# Patient Record
Sex: Female | Born: 2000 | Race: Black or African American | Hispanic: No | Marital: Single | Smoking: Never smoker
Health system: Southern US, Community
[De-identification: ages and names within clinical notes are randomized; demographics above are authoritative.]

## PROBLEM LIST (undated history)

## (undated) DIAGNOSIS — J45909 Unspecified asthma, uncomplicated: Secondary | ICD-10-CM

## (undated) DIAGNOSIS — A549 Gonococcal infection, unspecified: Secondary | ICD-10-CM

## (undated) DIAGNOSIS — A599 Trichomoniasis, unspecified: Secondary | ICD-10-CM

## (undated) DIAGNOSIS — A749 Chlamydial infection, unspecified: Secondary | ICD-10-CM

## (undated) HISTORY — DX: Chlamydial infection, unspecified: A74.9

## (undated) HISTORY — DX: Gonococcal infection, unspecified: A54.9

## (undated) HISTORY — DX: Trichomoniasis, unspecified: A59.9

---
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
## 2021-01-15 ENCOUNTER — Encounter (HOSPITAL_COMMUNITY): Payer: Self-pay

## 2021-01-15 ENCOUNTER — Other Ambulatory Visit: Payer: Self-pay

## 2021-01-15 ENCOUNTER — Ambulatory Visit (HOSPITAL_COMMUNITY)
Admission: RE | Admit: 2021-01-15 | Discharge: 2021-01-15 | Disposition: A | Discharge status: Home / Self Care | Payer: Medicaid Other | Source: Ambulatory Visit | Attending: Pediatrics | Admitting: Pediatrics

## 2021-01-15 VITALS — BP 125/83 | HR 80 | Temp 98.6°F | Resp 18

## 2021-01-15 DIAGNOSIS — N898 Other specified noninflammatory disorders of vagina: Secondary | ICD-10-CM

## 2021-01-15 NOTE — Discharge Instructions (Addendum)
Do not shave the inner areas of your labia.  Be careful when you are shaving.    Follow up with your primary care provider or gynecologist if your symptoms are not improving.

## 2021-01-15 NOTE — ED Triage Notes (Signed)
Patient reports a "cut" on labia.  Noticed this on Friday.  Denies vaginal discharge.  Patient did have pelvic pain on Friday, but this has gone away.  Denies urinary issues

## 2021-01-15 NOTE — ED Notes (Signed)
Instructed to place on gown 

## 2021-01-15 NOTE — ED Provider Notes (Signed)
[facility]    CSN: [id number] Arrival date & time: 01/15/21  1037      History   Chief Complaint Chief Complaint  Patient presents with  . Appointment    11:00  . Abdominal Pain    HPI Samantha Long is a 20 y.o. female.   Patient presents with a "cut" on her labia x5 days.  She states this occurred when she shaved.  She denies abdominal pain, dysuria, hematuria, vaginal discharge, pelvic pain, or other symptoms.  No treatments attempted at home.  Patient was seen here on 1/142022; treated with ceftriaxone and doxycycline; positive for gonorrhea and bacterial vaginitis.  Her medical history includes gonorrhea, chlamydia, trichomonas, PID, obesity.  The history is provided by the patient.    Past Medical History:  Diagnosis Date  . Chlamydia 09/24/2018  . Gonorrhea 09/24/2018  . Trichimoniasis 09/24/2018    Patient Active Problem List   Diagnosis Date Noted  . Vitamin D deficiency 12/09/2018  . Mixed hyperlipidemia 12/09/2018  . History of sexually transmitted disease 11/02/2018  . 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 Pelvic inflammatory disease (PID) 09/24/2018  . 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 No pertinent surgical history.  OB History   No obstetric history on file.      Home Medications    Prior to Admission medications   Not on File    Family History History reviewed. No pertinent family history.  Social History Social History   Tobacco Use  . Smoking status: Passive Smoke Exposure - Never Smoker  . Smokeless tobacco: Never Used  Substance Use Topics  . Alcohol use: No  . Drug use: No     Allergies   Amoxicillin   Review of Systems Review of Systems  Constitutional: Negative for chills and fever.  HENT: Negative for ear pain and sore throat.   Eyes: Negative for pain and visual  disturbance.  Respiratory: Negative for cough and shortness of breath.   Cardiovascular: Negative for chest pain and palpitations.  Gastrointestinal: Negative for abdominal pain, diarrhea and vomiting.  Genitourinary: Negative for dysuria, flank pain, hematuria and vaginal discharge.  Musculoskeletal: Negative for arthralgias and back pain.  Skin: Negative for color change and rash.  Neurological: Negative for seizures and syncope.  All other systems reviewed and are negative.    Physical Exam Triage Vital Signs ED Triage Vitals  Enc Vitals Group     BP 01/15/21 1100 125/83     Pulse Rate 01/15/21 1100 80     Resp 01/15/21 1100 18     Temp 01/15/21 1100 98.6 F (37 C)     Temp Source 01/15/21 1100 Oral     SpO2 01/15/21 1100 97 %     Weight --      Height --      Head Circumference --      Peak Flow --      Pain Score 01/15/21 1056 5     Pain Loc --      Pain Edu? --      Excl. in GC? --    No data found.  Updated Vital Signs BP 125/83 (BP Location: Left Arm) Comment (BP Location): large cuff  Pulse 80   Temp 98.6 F (37 C) (Oral)   Resp 18   LMP 12/19/2020   SpO2 97%   Visual Acuity Right Eye Distance:  Left Eye Distance:   Bilateral Distance:    Right Eye Near:   Left Eye Near:    Bilateral Near:     Physical Exam Vitals and nursing note reviewed.  Constitutional:      General: She is not in acute distress.    Appearance: She is well-developed and well-nourished.  HENT:     Head: Normocephalic and atraumatic.     Mouth/Throat:     Mouth: Mucous membranes are moist.  Eyes:     Conjunctiva/sclera: Conjunctivae normal.  Cardiovascular:     Rate and Rhythm: Normal rate and regular rhythm.     Heart sounds: Normal heart sounds.  Pulmonary:     Effort: Pulmonary effort is normal. No respiratory distress.     Breath sounds: Normal breath sounds.  Abdominal:     Palpations: Abdomen is soft.     Tenderness: There is no abdominal tenderness. There is no  right CVA tenderness, left CVA tenderness, guarding or rebound.  Genitourinary:    Exam position: Lithotomy position.     Vagina: No vaginal discharge.       Comments: Healing area of slight redness on right labia minora. No vaginal discharge.   Musculoskeletal:        General: No edema.     Cervical back: Neck supple.  Skin:    General: Skin is warm and dry.  Neurological:     General: No focal deficit present.     Mental Status: She is alert and oriented to person, place, and time.     Gait: Gait normal.  Psychiatric:        Mood and Affect: Mood and affect and mood normal.        Behavior: Behavior normal.      UC Treatments / Results  Labs (all labs ordered are listed, but only abnormal results are displayed) Labs Reviewed - No data to display  EKG   Radiology No results found.  Procedures Procedures (including critical care time)  Medications Ordered in UC Medications - No data to display  Initial Impression / Assessment and Plan / UC Course  I have reviewed the triage vital signs and the nursing notes.  Pertinent labs & imaging results that were available during my care of the patient were reviewed by me and considered in my medical decision making (see chart for details).   Vaginal irritation.  Instructed patient not to shave the inner labia and to be careful when she shaves her genitalia.  Instructed her to follow-up with her PCP or gynecologist if needed.  She agrees to plan of care.   Final Clinical Impressions(s) / UC Diagnoses   Final diagnoses:  Vaginal irritation     Discharge Instructions     Do not shave the inner areas of your labia.  Be careful when you are shaving.    Follow up with your primary care provider or gynecologist if your symptoms are not improving.          ED Prescriptions    None     PDMP not reviewed this encounter.   Mickie Bail, NP 01/15/21 1128

## 2021-02-04 ENCOUNTER — Ambulatory Visit (HOSPITAL_COMMUNITY)
Admission: RE | Admit: 2021-02-04 | Discharge: 2021-02-04 | Disposition: A | Discharge status: Home / Self Care | Payer: Medicaid Other | Source: Ambulatory Visit | Attending: Emergency Medicine | Admitting: Emergency Medicine

## 2021-02-04 ENCOUNTER — Other Ambulatory Visit: Payer: Self-pay

## 2021-02-04 ENCOUNTER — Encounter (HOSPITAL_COMMUNITY): Payer: Self-pay

## 2021-02-04 VITALS — BP 131/74 | HR 77 | Temp 98.0°F | Resp 18

## 2021-02-04 DIAGNOSIS — N760 Acute vaginitis: Secondary | ICD-10-CM | POA: Insufficient documentation

## 2021-02-04 MED ORDER — CEFTRIAXONE SODIUM 500 MG IJ SOLR
INTRAMUSCULAR | Status: AC
  Filled 2021-02-04: qty 500

## 2021-02-04 MED ORDER — DOXYCYCLINE HYCLATE 100 MG PO CAPS: 100.0000 mg | ORAL_CAPSULE | Freq: Two times a day (BID) | ORAL | 0 refills | Status: AC

## 2021-02-04 MED ORDER — CEFTRIAXONE SODIUM 500 MG IJ SOLR
500.0000 mg | Freq: Once | INTRAMUSCULAR | Status: AC
  Administered 2021-02-04: 500 mg via INTRAMUSCULAR

## 2021-02-04 MED ORDER — LIDOCAINE HCL (PF) 1 % IJ SOLN
INTRAMUSCULAR | Status: AC
  Filled 2021-02-04: qty 2

## 2021-02-04 MED ORDER — FLUCONAZOLE 200 MG PO TABS
ORAL_TABLET | ORAL | 0 refills | Status: DC
Start: 2021-02-04 — End: 2021-07-10

## 2021-02-04 NOTE — Discharge Instructions (Addendum)
We have initiated std treatment, as well as treatment for yeast infection.  We will notify of you any positive findings or if any changes to treatment are needed. If normal or otherwise without concern to your results, we will not call you. Please log on to your MyChart to review your results if interested in so.   Please withhold from intercourse for the next week. Please use condoms to prevent STD's.

## 2021-02-04 NOTE — ED Provider Notes (Signed)
[facility]    CSN: [id number] Arrival date & time: 02/04/21  1145      History   Chief Complaint Chief Complaint  Patient presents with  . APPOINTMENT: Vaginitis, Vaginal Discharge     HPI Samantha Long is a 20 y.o. female.   Samantha Long presents with complaints of vaginal discharge, itching and irritation which started approximately 1 week ago. No vaginal bleeding. No pelvic pain. No sores or lesions externally. Discharge is thick and white. She states she was exposed to a partner who had an std and did not seek treatment- didn't use condoms with this encounter. No urinary symptoms. LMP 2/13. Positive for gonorrhea 1/13.    ROS per HPI, negative if not otherwise mentioned.      Past Medical History:  Diagnosis Date  . Chlamydia 09/24/2018  . Gonorrhea 09/24/2018  . Trichimoniasis 09/24/2018    Patient Active Problem List   Diagnosis Date Noted  . Vitamin D deficiency 12/09/2018  . Mixed hyperlipidemia 12/09/2018  . History of sexually transmitted disease 11/02/2018  . 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 Pelvic inflammatory disease (PID) 09/24/2018  . 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 No pertinent surgical history.  OB History   No obstetric history on file.      Home Medications    Prior to Admission medications   Medication Sig Start Date End Date Taking? Authorizing Provider  doxycycline (VIBRAMYCIN) 100 MG capsule Take 1 capsule (100 mg total) by mouth 2 (two) times daily for 7 days. 02/04/21 02/11/21 Yes Joselynn Amoroso, Dorene Grebe B, NP  fluconazole (DIFLUCAN) 200 MG tablet Take once today. Take second pill at completion of antibiotics. 02/04/21  Yes Georgetta Haber, NP    Family History History reviewed. No pertinent family history.  Social History Social History   Tobacco Use  . Smoking status: Passive Smoke  Exposure - Never Smoker  . Smokeless tobacco: Never Used  Substance Use Topics  . Alcohol use: No  . Drug use: No     Allergies   Amoxicillin   Review of Systems Review of Systems   Physical Exam Triage Vital Signs ED Triage Vitals  Enc Vitals Group     BP 02/04/21 1220 131/74     Pulse Rate 02/04/21 1220 77     Resp 02/04/21 1220 18     Temp 02/04/21 1220 98 F (36.7 C)     Temp Source 02/04/21 1220 Oral     SpO2 02/04/21 1220 98 %     Weight --      Height --      Head Circumference --      Peak Flow --      Pain Score 02/04/21 1219 5     Pain Loc --      Pain Edu? --      Excl. in GC? --    No data found.  Updated Vital Signs BP 131/74 (BP Location: Left Arm)   Pulse 77   Temp 98 F (36.7 C) (Oral)   Resp 18   LMP 01/20/2021   SpO2 98%   Visual Acuity Right Eye Distance:   Left Eye Distance:   Bilateral Distance:    Right Eye Near:   Left Eye Near:    Bilateral Near:     Physical Exam Constitutional:      General: She is not in  acute distress.    Appearance: She is well-developed.  Cardiovascular:     Rate and Rhythm: Normal rate.  Pulmonary:     Effort: Pulmonary effort is normal.  Abdominal:     Palpations: Abdomen is not rigid.     Tenderness: There is no abdominal tenderness. There is no guarding or rebound.  Genitourinary:    Comments: Denies sores, lesions, vaginal bleeding; no pelvic pain; gu exam deferred at this time, vaginal self swab collected.   Skin:    General: Skin is warm and dry.  Neurological:     Mental Status: She is alert and oriented to person, place, and time.      UC Treatments / Results  Labs (all labs ordered are listed, but only abnormal results are displayed) Labs Reviewed  CERVICOVAGINAL ANCILLARY ONLY    EKG   Radiology No results found.  Procedures Procedures (including critical care time)  Medications Ordered in UC Medications  cefTRIAXone (ROCEPHIN) injection 500 mg (has no  administration in time range)    Initial Impression / Assessment and Plan / UC Course  I have reviewed the triage vital signs and the nursing notes.  Pertinent labs & imaging results that were available during my care of the patient were reviewed by me and considered in my medical decision making (see chart for details).     Empiric std treatment initiated given patient's concern for exposure and recent positive testing. Diflucan also provided. Vaginal cytology pending. Safe sex encouraged. Patient verbalized understanding and agreeable to plan.   Final Clinical Impressions(s) / UC Diagnoses   Final diagnoses:  Acute vaginitis     Discharge Instructions     We have initiated std treatment, as well as treatment for yeast infection.  We will notify of you any positive findings or if any changes to treatment are needed. If normal or otherwise without concern to your results, we will not call you. Please log on to your MyChart to review your results if interested in so.   Please withhold from intercourse for the next week. Please use condoms to prevent STD's.      ED Prescriptions    Medication Sig Dispense Auth. Provider   doxycycline (VIBRAMYCIN) 100 MG capsule Take 1 capsule (100 mg total) by mouth 2 (two) times daily for 7 days. 14 capsule Linus Mako B, NP   fluconazole (DIFLUCAN) 200 MG tablet Take once today. Take second pill at completion of antibiotics. 2 tablet Georgetta Haber, NP     PDMP not reviewed this encounter.   Georgetta Haber, NP 02/04/21 1244

## 2021-02-04 NOTE — ED Triage Notes (Signed)
Pt presents with vaginal irritation, vaginal discharge & itching X 1 week.

## 2021-02-05 ENCOUNTER — Other Ambulatory Visit: Payer: Self-pay | Admitting: Pediatrics

## 2021-02-05 LAB — CERVICOVAGINAL ANCILLARY ONLY
Bacterial Vaginitis (gardnerella): POSITIVE — AB
Candida Glabrata: NEGATIVE
Candida Vaginitis: POSITIVE — AB
Chlamydia: NEGATIVE
Comment: NEGATIVE
Comment: NEGATIVE
Comment: NEGATIVE
Comment: NEGATIVE
Comment: NEGATIVE
Comment: NORMAL
Neisseria Gonorrhea: NEGATIVE
Trichomonas: POSITIVE — AB

## 2021-02-05 MED ORDER — METRONIDAZOLE 500 MG PO TABS: 2000.0000 mg | ORAL_TABLET | Freq: Once | ORAL | 0 refills | Status: AC

## 2021-06-24 IMAGING — CR DG SHOULDER 2+V*R*
4 series · 4 of 4 positions shown · non-contrast
Comparison: None.

CLINICAL DATA: Shoulder pain.

EXAM:
RIGHT SHOULDER - 2+ VIEW

[w shoulder external right]
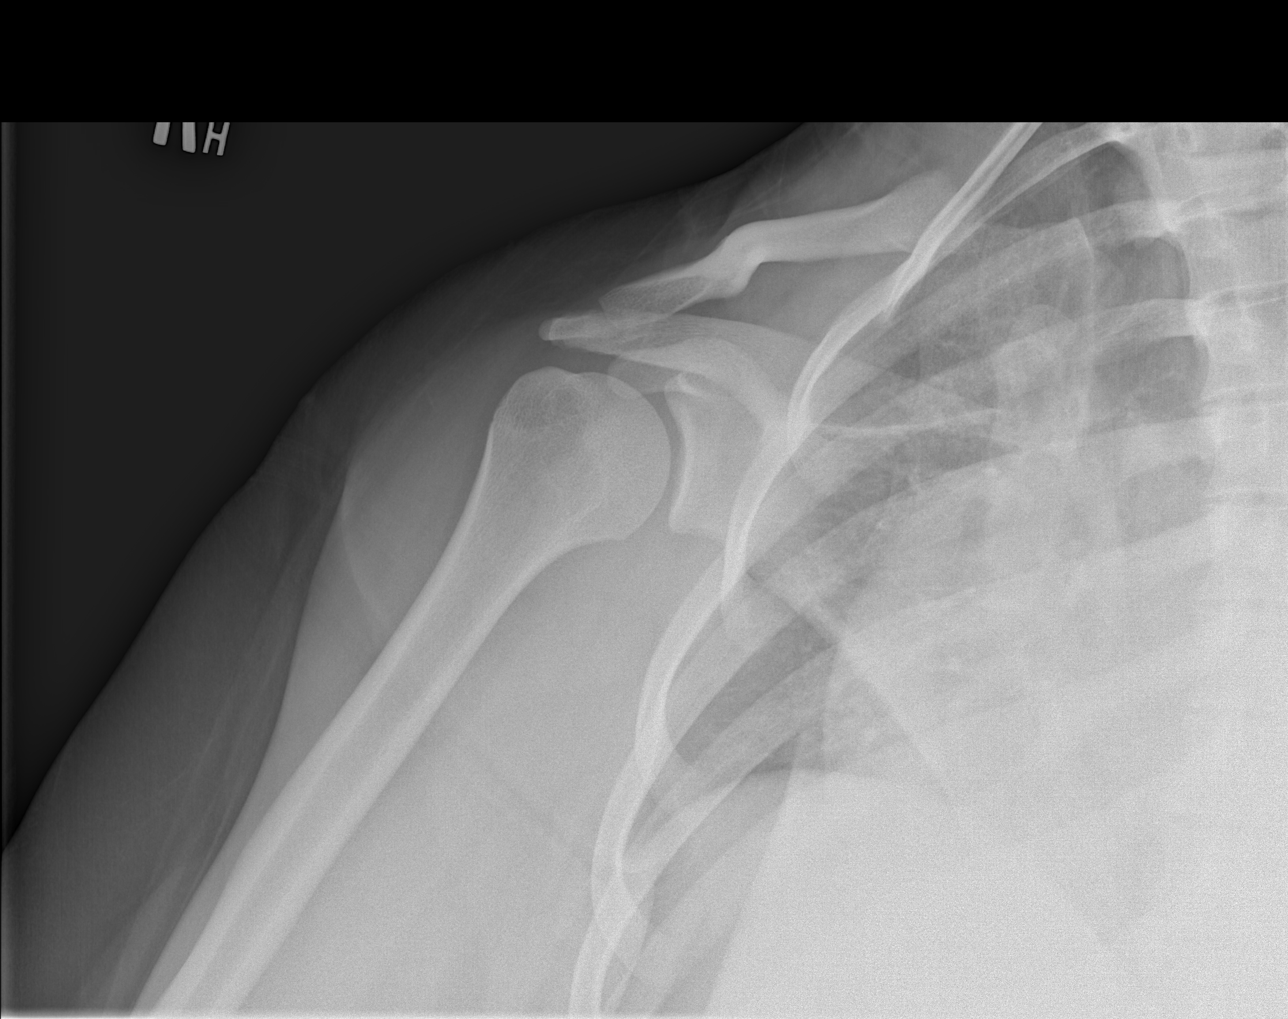

[w shoulder y-view right (1 of 2)]
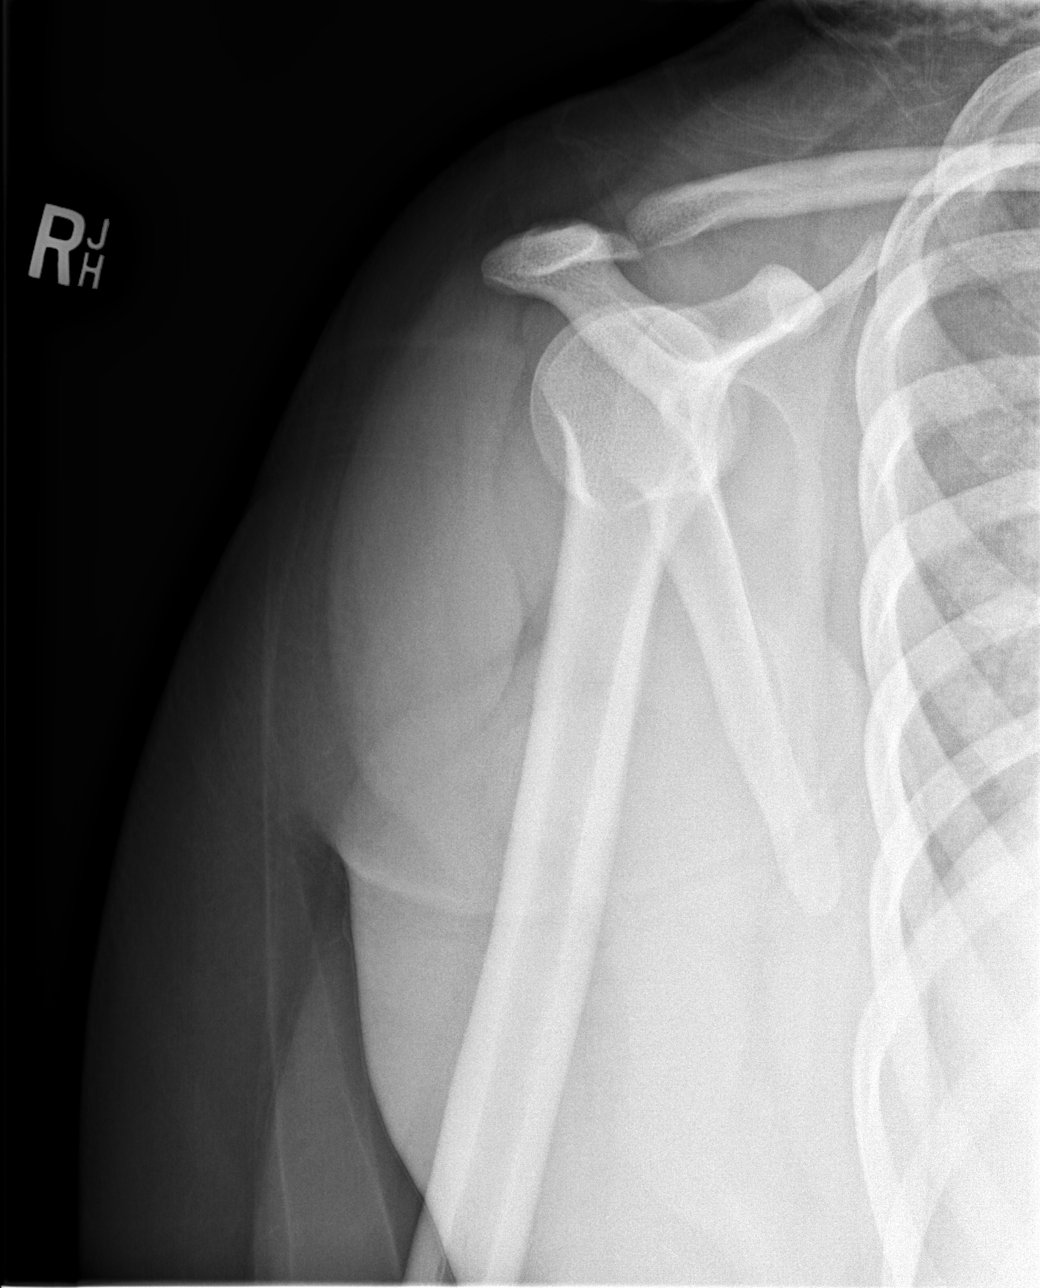

[w shoulder y-view right (2 of 2)]
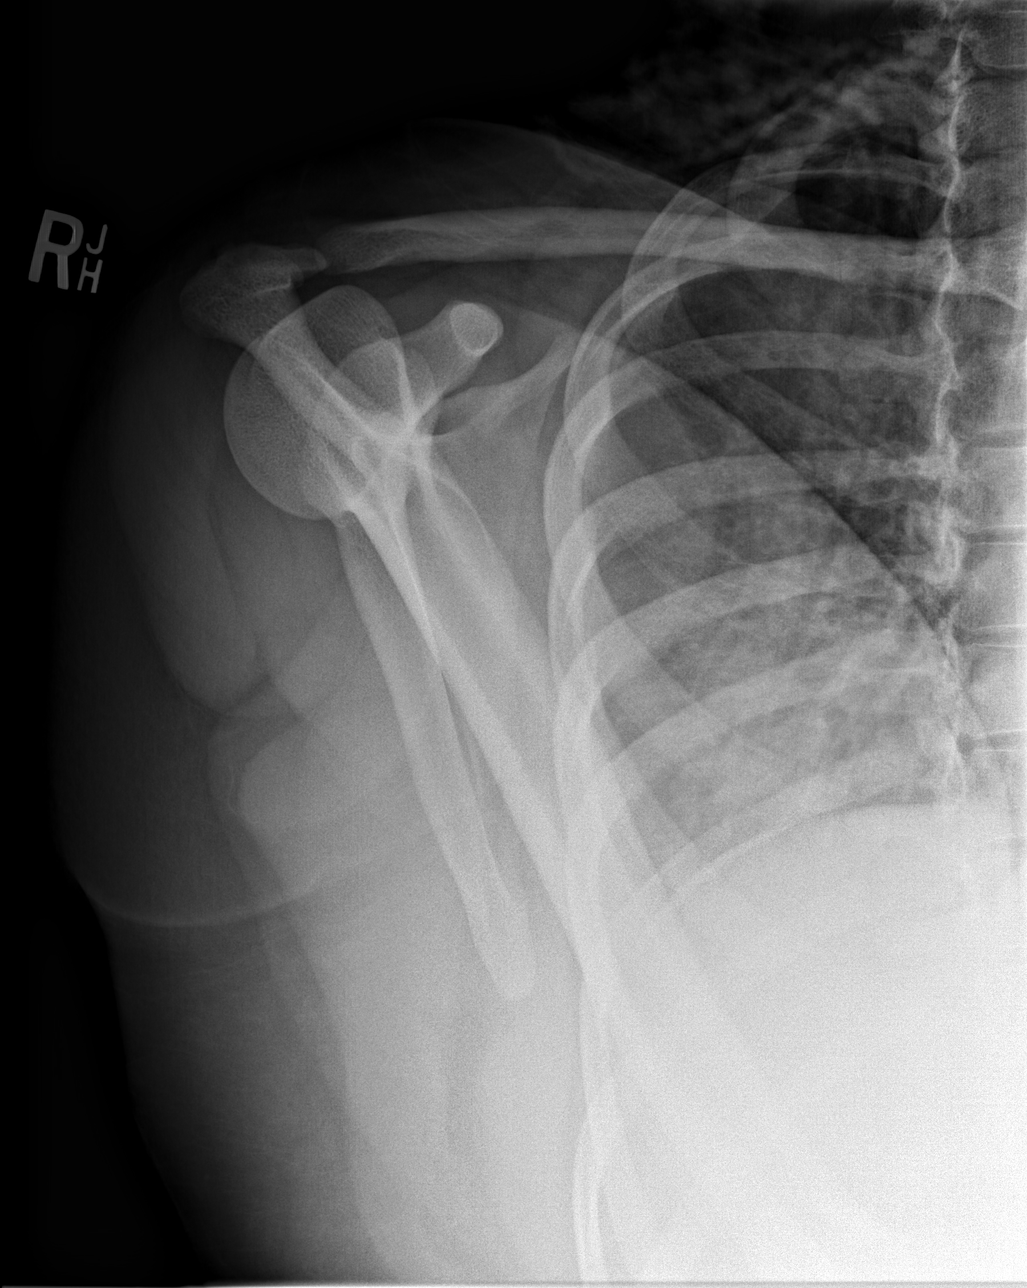

[x shoulder axillary right]
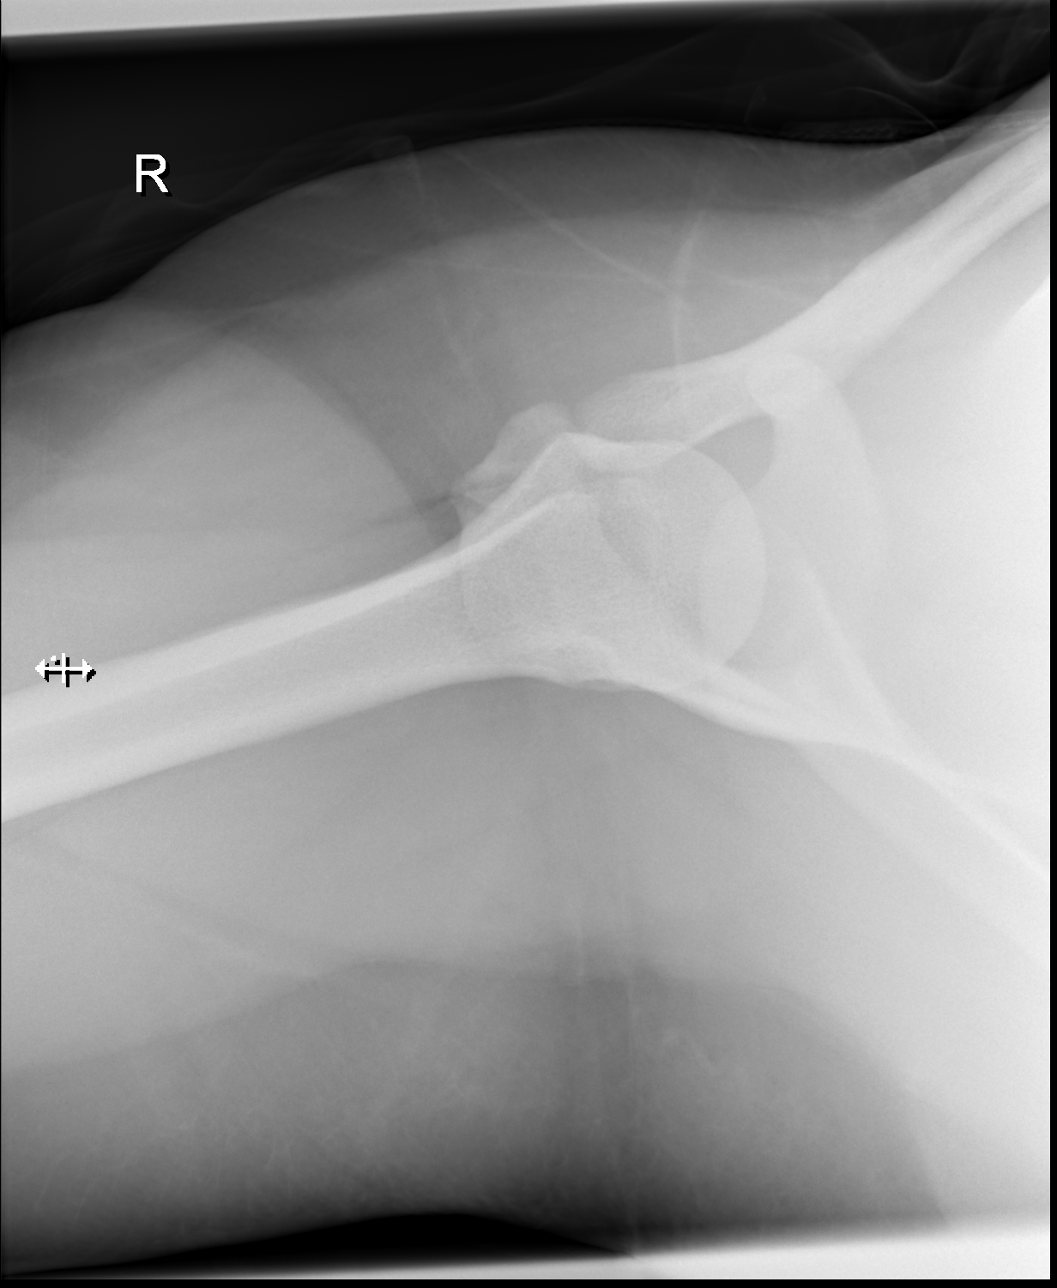

[4 of 4 positions shown; findings below may reference images not displayed]

FINDINGS: There is no evidence of fracture or dislocation. There is no
evidence of arthropathy or other focal bone abnormality. Soft
tissues are unremarkable.
IMPRESSION: Negative.

## 2021-07-10 ENCOUNTER — Other Ambulatory Visit: Payer: Self-pay

## 2021-07-10 ENCOUNTER — Ambulatory Visit (HOSPITAL_COMMUNITY)
Admission: RE | Admit: 2021-07-10 | Discharge: 2021-07-10 | Disposition: A | Discharge status: Home / Self Care | Payer: Medicaid Other | Source: Ambulatory Visit | Attending: Student | Admitting: Student

## 2021-07-10 ENCOUNTER — Encounter (HOSPITAL_COMMUNITY): Payer: Self-pay

## 2021-07-10 VITALS — BP 125/67 | HR 77 | Temp 99.0°F | Resp 18

## 2021-07-10 DIAGNOSIS — N3001 Acute cystitis with hematuria: Secondary | ICD-10-CM | POA: Diagnosis present

## 2021-07-10 LAB — POCT URINALYSIS DIPSTICK, ED / UC
Bilirubin Urine: NEGATIVE
Glucose, UA: NEGATIVE mg/dL
Ketones, ur: NEGATIVE mg/dL
Nitrite: NEGATIVE
Protein, ur: 30 mg/dL — AB
Specific Gravity, Urine: 1.02 (ref 1.005–1.030)
Urobilinogen, UA: 1 mg/dL (ref 0.0–1.0)
pH: 6 (ref 5.0–8.0)

## 2021-07-10 LAB — POC URINE PREG, ED: Preg Test, Ur: NEGATIVE

## 2021-07-10 MED ORDER — NITROFURANTOIN MONOHYD MACRO 100 MG PO CAPS: 100.0000 mg | ORAL_CAPSULE | Freq: Two times a day (BID) | ORAL | 0 refills | Status: DC

## 2021-07-10 NOTE — Discharge Instructions (Addendum)
-  Macrobid (nitrofurantoin) twice daily x5 days -Drink plenty of water to help flush the kidneys and bladder -Seek additional medical attention if symptoms are getting worse instead of better, like abdominal pain, back pain, fever/chills.

## 2021-07-10 NOTE — ED Provider Notes (Signed)
[facility]    CSN: [id number] Arrival date & time: 07/10/21  1500      History   Chief Complaint Chief Complaint  Patient presents with   Appointment    1300   Urinary Frequency   Abdominal Pain    HPI Samantha Long is a 20 y.o. female presenting with urinary frequency and abd pain x2 days. Medical history C/G/T. Last positive for BV and gonorrhea 12/2020, and BV, trich, candida 01/2021. Suprapubic pressure and dysuria and frequency. Denies hematuria, urgency, back pain, n/v/d, fevers/chills, abdnormal vaginal discharge/lesions. Denies new partners. Not on her period currently.   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 No pertinent surgical history.  OB History   No obstetric history on file.      Home Medications    Prior to Admission medications   Medication Sig Start Date End Date Taking? Authorizing Provider  nitrofurantoin, macrocrystal-monohydrate, (MACROBID) 100 MG capsule Take 1 capsule (100 mg total) by mouth 2 (two) times daily. 07/10/21  Yes Rhys Martini, PA-C    Family History History reviewed. No pertinent family history.  Social History Social History   Tobacco Use   Smoking status: Passive Smoke Exposure - Never Smoker   Smokeless tobacco: Never  Substance Use Topics   Alcohol use: No   Drug use: No     Allergies   Amoxicillin   Review of Systems Review of Systems  Constitutional:  Negative for appetite change, chills,  diaphoresis and fever.  Respiratory:  Negative for shortness of breath.   Cardiovascular:  Negative for chest pain.  Gastrointestinal:  Positive for abdominal pain. Negative for blood in stool, constipation, diarrhea, nausea and vomiting.  Genitourinary:  Positive for dysuria and frequency. Negative for decreased urine volume, difficulty urinating, flank pain, genital sores, hematuria and urgency.  Musculoskeletal:  Negative for back pain.  Neurological:  Negative for dizziness, weakness and light-headedness.  All other systems reviewed and are negative.   Physical Exam Triage Vital Signs ED Triage Vitals  Enc Vitals Group     BP 07/10/21 1538 125/67     Pulse Rate 07/10/21 1538 77     Resp 07/10/21 1538 18     Temp 07/10/21 1538 99 F (37.2 C)     Temp Source 07/10/21 1538 Oral     SpO2 07/10/21 1538 100 %     Weight --      Height --      Head Circumference --      Peak Flow --      Pain Score 07/10/21 1536 8     Pain Loc --      Pain Edu? --      Excl. in [place]? --    No data found.  Updated Vital Signs BP 125/67 (BP Location: Left Arm)   Pulse 77   Temp 99 F (37.2 C) (Oral)   Resp 18   LMP  (Approximate) Comment: 1 month  SpO2 100%   Visual Acuity Right  Eye Distance:   Left Eye Distance:   Bilateral Distance:    Right Eye Near:   Left Eye Near:    Bilateral Near:     Physical Exam Vitals reviewed.  Constitutional:      General: She is not in acute distress.    Appearance: Normal appearance. She is not ill-appearing.  HENT:     Head: Normocephalic and atraumatic.     Mouth/Throat:     Mouth: Mucous membranes are moist.     Comments: Moist mucous membranes Eyes:     Extraocular Movements: Extraocular movements intact.     Pupils: Pupils are equal, round, and reactive to light.  Cardiovascular:     Rate and Rhythm: Normal rate and regular rhythm.     Heart sounds: Normal heart sounds.  Pulmonary:     Effort: Pulmonary effort is normal.     Breath  sounds: Normal breath sounds. No wheezing, rhonchi or rales.  Abdominal:     General: Abdomen is flat. Bowel sounds are normal. There is no distension.     Palpations: Abdomen is soft. There is no mass.     Tenderness: There is abdominal tenderness in the suprapubic area. There is no right CVA tenderness, left CVA tenderness, guarding or rebound. Negative signs include Murphy's sign, Rovsing's sign and McBurney's sign.  Skin:    General: Skin is warm.     Capillary Refill: Capillary refill takes less than 2 seconds.     Comments: Good skin turgor  Neurological:     General: No focal deficit present.     Mental Status: She is alert and oriented to person, place, and time.  Psychiatric:        Mood and Affect: Mood normal.        Behavior: Behavior normal.     UC Treatments / Results  Labs (all labs ordered are listed, but only abnormal results are displayed) Labs Reviewed  POCT URINALYSIS DIPSTICK, ED / UC - Abnormal; Notable for the following components:      Result Value   Hgb urine dipstick MODERATE (*)    Protein, ur 30 (*)    Leukocytes,Ua SMALL (*)    All other components within normal limits  URINE CULTURE  POC URINE PREG, ED    EKG   Radiology No results found.  Procedures Procedures (including critical care time)  Medications Ordered in UC Medications - No data to display  Initial Impression / Assessment and Plan / UC Course  I have reviewed the triage vital signs and the nursing notes.  Pertinent labs & imaging results that were available during my care of the patient were reviewed by me and considered in my medical decision making (see chart for details).     This patient is a very pleasant 20 y.o. year old female presenting with acute cystitis with hematuria. Afebrile, nontachy, suprapubic pressure but no CVAT. History BV, yeast, G/C/T.  UA with blood and trace leuk. Culture sent. Denies STI risk, did not send for testing.  Negative  u-preg.  Penicillin allergic. Will treat for UTI with macrobid as below.   ED return precautions discussed. Patient verbalizes understanding and agreement.   Coding this visit a level 4 for review of past notes and labs, order and interpretation of labs today, and prescription drug management.  Final Clinical Impressions(s) / UC Diagnoses   Final diagnoses:  Acute cystitis with hematuria     Discharge Instructions      -Macrobid (nitrofurantoin) twice  daily x5 days -Drink plenty of water to help flush the kidneys and bladder -Seek additional medical attention if symptoms are getting worse instead of better, like abdominal pain, back pain, fever/chills.     ED Prescriptions     Medication Sig Dispense Auth. Provider   nitrofurantoin, macrocrystal-monohydrate, (MACROBID) 100 MG capsule Take 1 capsule (100 mg total) by mouth 2 (two) times daily. 10 capsule Rhys Martini, PA-C      PDMP not reviewed this encounter.   Rhys Martini, PA-C 07/10/21 [id number]

## 2021-07-10 NOTE — ED Triage Notes (Signed)
Pt report lower abdominal pain, nausea and increased urinary frequency x 2 days.

## 2021-07-12 LAB — URINE CULTURE: Culture: 30000 — AB

## 2021-08-04 ENCOUNTER — Ambulatory Visit (HOSPITAL_COMMUNITY): Payer: Self-pay

## 2022-02-01 ENCOUNTER — Ambulatory Visit (HOSPITAL_COMMUNITY): Payer: Self-pay

## 2022-02-04 ENCOUNTER — Ambulatory Visit (HOSPITAL_COMMUNITY)
Admission: RE | Admit: 2022-02-04 | Discharge: 2022-02-04 | Disposition: A | Discharge status: Home / Self Care | Payer: Medicaid Other | Source: Ambulatory Visit | Attending: Emergency Medicine | Admitting: Emergency Medicine

## 2022-02-04 ENCOUNTER — Encounter (HOSPITAL_COMMUNITY): Payer: Self-pay

## 2022-02-04 ENCOUNTER — Other Ambulatory Visit: Payer: Self-pay

## 2022-02-04 VITALS — BP 131/81 | HR 79 | Temp 98.6°F | Resp 16 | Ht 62.0 in | Wt 190.0 lb

## 2022-02-04 DIAGNOSIS — R350 Frequency of micturition: Secondary | ICD-10-CM | POA: Diagnosis not present

## 2022-02-04 LAB — POCT URINALYSIS DIPSTICK, ED / UC
Bilirubin Urine: NEGATIVE
Glucose, UA: NEGATIVE mg/dL
Ketones, ur: NEGATIVE mg/dL
Nitrite: NEGATIVE
Protein, ur: NEGATIVE mg/dL
Specific Gravity, Urine: 1.02 (ref 1.005–1.030)
Urobilinogen, UA: 0.2 mg/dL (ref 0.0–1.0)
pH: 6 (ref 5.0–8.0)

## 2022-02-04 LAB — HIV ANTIBODY (ROUTINE TESTING W REFLEX): HIV Screen 4th Generation wRfx: NONREACTIVE

## 2022-02-04 LAB — RPR: RPR Ser Ql: NONREACTIVE

## 2022-02-04 NOTE — ED Provider Notes (Signed)
[facility]    CSN: [id number] Arrival date & time: 02/04/22  1148      History   Chief Complaint Chief Complaint  Patient presents with   xxx    HPI Samantha Long is a 21 y.o. female.   Patient presents with urinary frequency over the last week.  Denies frequency, hematuria, dysuria, vaginal discharge, itching, odor, abdominal pain or pressure, flank pain, fever, chills.  Sexually active, 1 partner, always condom use.  No known exposure.  Requesting STD testing.  History of gonorrhea, chlamydia and trichomoniasis.  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 No pertinent surgical history.  OB History   No obstetric history on file.      Home Medications    Prior to Admission medications   Medication Sig Start Date End Date Taking? Authorizing Provider  nitrofurantoin, macrocrystal-monohydrate, (MACROBID) 100 MG capsule Take 1 capsule (100 mg total) by mouth 2 (two) times daily. 07/10/21   Hazel Sams, PA-C    Family History Family History  Problem Relation Age of Onset   Healthy Mother    Healthy Father     Social History Social History   Tobacco Use   Smoking status: Never    Passive exposure: Yes   Smokeless tobacco: Never  Substance Use Topics   Alcohol use: No   Drug use: No     Allergies   Amoxicillin   Review of Systems Review of Systems  Constitutional: Negative.   Genitourinary:  Positive for frequency. Negative for decreased  urine volume, difficulty urinating, dyspareunia, dysuria, enuresis, flank pain, genital sores, hematuria, menstrual problem, pelvic pain, urgency, vaginal bleeding, vaginal discharge and vaginal pain.  Musculoskeletal: Negative.   Skin: Negative.     Physical Exam Triage Vital Signs ED Triage Vitals  Enc Vitals Group     BP 02/04/22 1206 131/81     Pulse Rate 02/04/22 1206 79     Resp 02/04/22 1206 16     Temp 02/04/22 1206 98.6 F (37 C)     Temp Source 02/04/22 1206 Oral     SpO2 02/04/22 1206 97 %     Weight 02/04/22 1205 190 lb 0.6 oz (86.2 kg)     Height 02/04/22 1205 5\' 2"  (1.575 m)     Head Circumference --      Peak Flow --      Pain Score 02/04/22 1205 0     Pain Loc --      Pain Edu? --      Excl. in [place]? --    No data found.  Updated Vital Signs BP 131/81 (BP Location: Left Arm)    Pulse 79    Temp 98.6 F (37 C) (Oral)    Resp 16    Ht 5\' 2"  (1.575 m)    Wt 190 lb 0.6 oz (86.2 kg)    SpO2 97%    BMI 34.76 kg/m   Visual Acuity Right Eye Distance:   Left Eye Distance:   Bilateral  Distance:    Right Eye Near:   Left Eye Near:    Bilateral Near:     Physical Exam Constitutional:      Appearance: Normal appearance.  HENT:     Head: Normocephalic.  Eyes:     Extraocular Movements: Extraocular movements intact.  Pulmonary:     Effort: Pulmonary effort is normal.  Abdominal:     General: Abdomen is flat. Bowel sounds are normal. There is no distension.     Palpations: Abdomen is soft.     Tenderness: There is no abdominal tenderness. There is no right CVA tenderness or left CVA tenderness.  Genitourinary:    Comments: Deferred Skin:    General: Skin is warm and dry.  Neurological:     Mental Status: She is alert and oriented to person, place, and time. Mental status is at baseline.  Psychiatric:        Mood and Affect: Mood normal.        Behavior: Behavior normal.     UC Treatments / Results  Labs (all labs ordered are listed, but only abnormal  results are displayed) Labs Reviewed  HIV ANTIBODY (ROUTINE TESTING W REFLEX)  RPR  URINALYSIS, ROUTINE W REFLEX MICROSCOPIC  CERVICOVAGINAL ANCILLARY ONLY    EKG   Radiology No results found.  Procedures Procedures (including critical care time)  Medications Ordered in UC Medications - No data to display  Initial Impression / Assessment and Plan / UC Course  I have reviewed the triage vital signs and the nursing notes.  Pertinent labs & imaging results that were available during my care of the patient were reviewed by me and considered in my medical decision making (see chart for details).  Urinary frequency  Urinalysis positive for trace leukocytes, negative for nitrates or bacteria, discussed findings with patient, STI labs pending, will treat per protocol, advised abstinence until lab results and/or treatment is complete and all symptoms have resolved, may follow-up with urgent care as needed Final Clinical Impressions(s) / UC Diagnoses   Final diagnoses:  None   Discharge Instructions   None    ED Prescriptions   None    PDMP not reviewed this encounter.   Hans Eden, NP 02/04/22 1234

## 2022-02-04 NOTE — ED Triage Notes (Signed)
Pt requesting STD testing with blood work. Denies any symptoms at this time.

## 2022-02-04 NOTE — Discharge Instructions (Signed)
Urinalysis negative for infection  Labs pending 2-3 days, you will be contacted if positive for any sti and treatment will be sent to the pharmacy, you will have to return to the clinic if positive for gonorrhea to receive treatment   Please refrain from having sex until labs results, if positive please refrain from having sex until treatment complete and symptoms resolve   If positive for HIV, Syphilis, Chlamydia  gonorrhea or trichomoniasis please notify partner or partners so they may tested as well  Moving forward, it is recommended you use some form of protection against the transmission of sti infections  such as condoms or dental dams with each sexual encounter

## 2022-02-05 ENCOUNTER — Telehealth (HOSPITAL_COMMUNITY): Payer: Self-pay | Admitting: Emergency Medicine

## 2022-02-05 LAB — CERVICOVAGINAL ANCILLARY ONLY
Bacterial Vaginitis (gardnerella): POSITIVE — AB
Candida Glabrata: NEGATIVE
Candida Vaginitis: NEGATIVE
Chlamydia: NEGATIVE
Comment: NEGATIVE
Comment: NEGATIVE
Comment: NEGATIVE
Comment: NEGATIVE
Comment: NEGATIVE
Comment: NORMAL
Neisseria Gonorrhea: NEGATIVE
Trichomonas: POSITIVE — AB

## 2022-02-05 MED ORDER — METRONIDAZOLE 500 MG PO TABS: 500.0000 mg | ORAL_TABLET | Freq: Two times a day (BID) | ORAL | 0 refills | Status: DC

## 2022-02-21 ENCOUNTER — Ambulatory Visit (HOSPITAL_COMMUNITY): Payer: Self-pay

## 2022-05-06 ENCOUNTER — Encounter (HOSPITAL_COMMUNITY): Payer: Self-pay

## 2022-05-06 ENCOUNTER — Ambulatory Visit (HOSPITAL_COMMUNITY)
Admission: RE | Admit: 2022-05-06 | Discharge: 2022-05-06 | Disposition: A | Discharge status: Home / Self Care | Payer: Medicaid Other | Source: Ambulatory Visit | Attending: Physician Assistant | Admitting: Physician Assistant

## 2022-05-06 VITALS — BP 120/78 | HR 78 | Temp 99.3°F | Resp 18

## 2022-05-06 DIAGNOSIS — J020 Streptococcal pharyngitis: Secondary | ICD-10-CM | POA: Diagnosis present

## 2022-05-06 LAB — POCT RAPID STREP A, ED / UC: Streptococcus, Group A Screen (Direct): NEGATIVE

## 2022-05-06 MED ORDER — AZITHROMYCIN 250 MG PO TABS: ORAL_TABLET | ORAL | 0 refills | Status: DC

## 2022-05-06 NOTE — ED Provider Notes (Signed)
[facility]    CSN: [id number] Arrival date & time: 05/06/22  1544      History   Chief Complaint Chief Complaint  Patient presents with   Sore Throat    My throat has been hurting for 3 days now an it's giving me a headache - Entered by patient   appt 4p    HPI Samantha Long is a 21 y.o. female.   Pt complains of sore throat and headache that started about two days ago.  Pain is worse with swallowing.  Reports her boyfriend recently tested positive for strep throat.  She denies cough, congestion, sinus pressure, ear pain, body aches, n/v.  She has taken nothing for her sx.    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 No pertinent surgical history.  OB History   No obstetric history on file.      Home Medications    Prior to Admission medications   Medication Sig Start Date End Date Taking? Authorizing Provider  azithromycin (ZITHROMAX Z-PAK) 250 MG tablet Take 2 tablets by mouth on the first day and then take 1 tablet by mouth daily for 4 days. 05/06/22  Yes Ward, Tylene Fantasia, PA-C    Family History Family History  Problem Relation Age of Onset   Healthy Mother    Healthy Father     Social History Social History   Tobacco Use   Smoking status: Never    Passive exposure: Yes   Smokeless tobacco: Never  Substance Use Topics   Alcohol use: No   Drug use: No     Allergies   Amoxicillin   Review of Systems Review of Systems  Constitutional:   Negative for chills and fever.  HENT:  Positive for sore throat. Negative for ear pain.   Eyes:  Negative for pain and visual disturbance.  Respiratory:  Negative for cough and shortness of breath.   Cardiovascular:  Negative for chest pain and palpitations.  Gastrointestinal:  Negative for abdominal pain and vomiting.  Genitourinary:  Negative for dysuria and hematuria.  Musculoskeletal:  Negative for arthralgias and back pain.  Skin:  Negative for color change and rash.  Neurological:  Positive for headaches. Negative for seizures and syncope.  All other systems reviewed and are negative.   Physical Exam Triage Vital Signs ED Triage Vitals  Enc Vitals Group     BP 05/06/22 1634 120/78     Pulse Rate 05/06/22 1634 78     Resp 05/06/22 1634 18     Temp 05/06/22 1634 99.3 F (37.4 C)     Temp Source 05/06/22 1634 Oral     SpO2 05/06/22 1634 97 %     Weight --      Height --      Head Circumference --      Peak Flow --      Pain Score 05/06/22 1632 10     Pain Loc --      Pain Edu? --  Excl. in [place]? --    No data found.  Updated Vital Signs BP 120/78 (BP Location: Right Arm)   Pulse 78   Temp 99.3 F (37.4 C) (Oral)   Resp 18   LMP 04/28/2022   SpO2 97%   Visual Acuity Right Eye Distance:   Left Eye Distance:   Bilateral Distance:    Right Eye Near:   Left Eye Near:    Bilateral Near:     Physical Exam Vitals and nursing note reviewed.  Constitutional:      General: She is not in acute distress.    Appearance: She is well-developed.  HENT:     Head: Normocephalic and atraumatic.     Mouth/Throat:     Pharynx: Pharyngeal swelling and posterior oropharyngeal erythema present.     Tonsils: Tonsillar exudate present. 2+ on the right. 2+ on the left.  Eyes:     Conjunctiva/sclera: Conjunctivae normal.  Cardiovascular:     Rate and Rhythm: Normal rate and regular rhythm.     Heart sounds: No murmur heard. Pulmonary:     Effort: Pulmonary effort is  normal. No respiratory distress.     Breath sounds: Normal breath sounds.  Abdominal:     Palpations: Abdomen is soft.     Tenderness: There is no abdominal tenderness.  Musculoskeletal:        General: No swelling.     Cervical back: Neck supple.  Skin:    General: Skin is warm and dry.     Capillary Refill: Capillary refill takes less than 2 seconds.  Neurological:     Mental Status: She is alert.  Psychiatric:        Mood and Affect: Mood normal.     UC Treatments / Results  Labs (all labs ordered are listed, but only abnormal results are displayed) Labs Reviewed  CULTURE, GROUP A STREP [facility])  POCT RAPID STREP A, ED / UC    EKG   Radiology No results found.  Procedures Procedures (including critical care time)  Medications Ordered in UC Medications - No data to display  Initial Impression / Assessment and Plan / UC Course  I have reviewed the triage vital signs and the nursing notes.  Pertinent labs & imaging results that were available during my care of the patient were reviewed by me and considered in my medical decision making (see chart for details).     Strep throat, will treat empirically.  Supportive care discussed.  Return precautions discussed. Work note provided.  Final Clinical Impressions(s) / UC Diagnoses   Final diagnoses:  Strep throat     Discharge Instructions      Take antibiotic as prescribed Recommend salt water gargles Can take ibuprofen or tylenol as needed for pain Return if symptoms become worse.      ED Prescriptions     Medication Sig Dispense Auth. Provider   azithromycin (ZITHROMAX Z-PAK) 250 MG tablet Take 2 tablets by mouth on the first day and then take 1 tablet by mouth daily for 4 days. 6 each Ward, Tylene Fantasia, PA-C      PDMP not reviewed this encounter.   Ward, Tylene Fantasia, PA-C 05/06/22 2022

## 2022-05-06 NOTE — Discharge Instructions (Addendum)
Take antibiotic as prescribed Recommend salt water gargles Can take ibuprofen or tylenol as needed for pain Return if symptoms become worse.

## 2022-05-06 NOTE — ED Triage Notes (Signed)
Pt c/o sore throat and headache for few days.

## 2022-05-09 LAB — CULTURE, GROUP A STREP (THRC)

## 2022-05-21 ENCOUNTER — Ambulatory Visit (HOSPITAL_COMMUNITY): Payer: Self-pay

## 2022-06-19 ENCOUNTER — Encounter (HOSPITAL_COMMUNITY): Payer: Self-pay

## 2022-06-19 ENCOUNTER — Ambulatory Visit (HOSPITAL_COMMUNITY)
Admission: RE | Admit: 2022-06-19 | Discharge: 2022-06-19 | Disposition: A | Discharge status: Home / Self Care | Payer: Medicaid Other | Source: Ambulatory Visit | Attending: Urgent Care | Admitting: Urgent Care

## 2022-06-19 VITALS — BP 128/82 | HR 97 | Temp 99.9°F | Resp 16 | Ht 63.0 in | Wt 180.0 lb

## 2022-06-19 DIAGNOSIS — J069 Acute upper respiratory infection, unspecified: Secondary | ICD-10-CM | POA: Diagnosis not present

## 2022-06-19 DIAGNOSIS — Z20822 Contact with and (suspected) exposure to covid-19: Secondary | ICD-10-CM | POA: Insufficient documentation

## 2022-06-19 NOTE — Discharge Instructions (Signed)
Your symptoms sound most consistent with a viral illness. We have tested you today for covid and will call with the results, however you do not meet the criteria for antiviral treatment. Supportive care is the mainstay of treatment - rest, hydration, frequent hand washing, tylenol or ibuprofen as needed for fever or aches.  You may also consider OTC Quercetin or Oscillococcinum to help with the symptoms.

## 2022-06-19 NOTE — ED Provider Notes (Signed)
[facility]    CSN: [id number] Arrival date & time: 06/19/22  1646      History   Chief Complaint Chief Complaint  Patient presents with   Fever    I'm burning up, my nose stopped up, my throat feels like it's starting to close up, an I feel weak - Entered by patient   Nasal Congestion   Headache   Fatigue    HPI Samantha Long is a 21 y.o. female.   Pleasant 21 year old female presents today with a 3-day onset of nasal congestion, fatigue, headache, and hot cold chills.  She is only checked her temperature once and it was 99.1 at that time.  She states she has felt much hotter than that.  She states her stomach feels slightly queasy and nauseated, but denies any vomiting.  She denies any abdominal pain, diarrhea, rash, or body aches.  She denies any sick contact exposures.  She does have a history of COVID in the past, and states this feels similar.  She has been vaccinated against COVID.  She has not tried any over-the-counter medications for symptoms. She denies any chronic medical problems and takes no daily Rx medications.   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 No pertinent surgical history.  OB History   No obstetric history on file.      Home Medications    Prior to Admission medications   Not on File    Family History Family History  Problem Relation Age of Onset    Healthy Mother    Healthy Father     Social History Social History   Tobacco Use   Smoking status: Never    Passive exposure: Yes   Smokeless tobacco: Never  Substance Use Topics   Alcohol use: No   Drug use: No     Allergies   Amoxicillin   Review of Systems Review of Systems  Constitutional:  Positive for fever.  Neurological:  Positive for headaches.     Physical Exam Triage Vital Signs ED Triage Vitals  Enc Vitals Group     BP 06/19/22 1701 128/82     Pulse Rate 06/19/22 1701 97     Resp 06/19/22 1701 16     Temp 06/19/22 1701 99.9 F (37.7 C)     Temp Source 06/19/22 1701 Oral     SpO2 06/19/22 1701 97 %     Weight 06/19/22 1700 180 lb (81.6 kg)     Height 06/19/22 1700 5\' 3"  (1.6 m)     Head Circumference --      Peak Flow --      Pain Score 06/19/22 1700 8     Pain Loc --      Pain Edu? --      Excl. in GC? --    No data found.  Updated Vital Signs BP 128/82 (BP Location: Left Arm)   Pulse 97  Temp 99.9 F (37.7 C) (Oral)   Resp 16   Ht 5\' 3"  (1.6 m)   Wt 180 lb (81.6 kg)   LMP 05/20/2022 (Within Weeks)   SpO2 97%   BMI 31.89 kg/m   Visual Acuity Right Eye Distance:   Left Eye Distance:   Bilateral Distance:    Right Eye Near:   Left Eye Near:    Bilateral Near:     Physical Exam Vitals and nursing note reviewed.  Constitutional:      General: She is not in acute distress.    Appearance: She is well-developed and normal weight. She is not ill-appearing, toxic-appearing or diaphoretic.  HENT:     Head: Normocephalic and atraumatic.     Mouth/Throat:     Mouth: Mucous membranes are moist.     Pharynx: Oropharynx is clear.  Eyes:     General: No scleral icterus.    Extraocular Movements: Extraocular movements intact.     Conjunctiva/sclera: Conjunctivae normal.     Pupils: Pupils are equal, round, and reactive to light.  Cardiovascular:     Rate and Rhythm: Normal rate and regular rhythm.     Heart sounds: Normal heart  sounds. No murmur heard.    No friction rub.  Pulmonary:     Effort: Pulmonary effort is normal. No respiratory distress.     Breath sounds: Normal breath sounds. No stridor. No wheezing, rhonchi or rales.  Chest:     Chest wall: No tenderness.  Abdominal:     General: Bowel sounds are normal. There is no distension.     Palpations: Abdomen is soft.     Tenderness: There is no abdominal tenderness.  Musculoskeletal:        General: No swelling or tenderness.     Cervical back: Normal range of motion and neck supple. No rigidity.  Lymphadenopathy:     Cervical: No cervical adenopathy.  Skin:    General: Skin is warm and dry.     Capillary Refill: Capillary refill takes less than 2 seconds.     Coloration: Skin is not cyanotic or pale.     Findings: No rash.  Neurological:     Mental Status: She is alert and oriented to person, place, and time.  Psychiatric:        Mood and Affect: Mood normal.      UC Treatments / Results  Labs (all labs ordered are listed, but only abnormal results are displayed) Labs Reviewed  SARS CORONAVIRUS 2 (TAT 6-24 HRS)    EKG   Radiology No results found.  Procedures Procedures (including critical care time)  Medications Ordered in UC Medications - No data to display  Initial Impression / Assessment and Plan / UC Course  I have reviewed the triage vital signs and the nursing notes.  Pertinent labs & imaging results that were available during my care of the patient were reviewed by me and considered in my medical decision making (see chart for details).     Viral URI -patient has not been taking any over-the-counter medications, recommended alternating Tylenol and ibuprofen.  COVID test obtained per patient request.  Symptomatic management with over-the-counter medications as appropriate at this time.  Vital signs stable.  Patient nontoxic.  Return to clinic precautions reviewed with patient.   Final Clinical Impressions(s) / UC  Diagnoses   Final diagnoses:  Viral URI     Discharge Instructions      Your symptoms sound most consistent with a viral  illness. We have tested you today for covid and will call with the results, however you do not meet the criteria for antiviral treatment. Supportive care is the mainstay of treatment - rest, hydration, frequent hand washing, tylenol or ibuprofen as needed for fever or aches.  You may also consider OTC Quercetin or Oscillococcinum to help with the symptoms.    ED Prescriptions   None    PDMP not reviewed this encounter.   Maretta Bees, [place] 06/19/22 1737

## 2022-06-19 NOTE — ED Triage Notes (Signed)
For three days Patient having fatigue, fever, congestion, and headaches. Patient having stomach pain as well.

## 2022-06-20 LAB — SARS CORONAVIRUS 2 (TAT 6-24 HRS): SARS Coronavirus 2: NEGATIVE

## 2023-01-01 DIAGNOSIS — Z113 Encounter for screening for infections with a predominantly sexual mode of transmission: Secondary | ICD-10-CM | POA: Diagnosis not present

## 2023-02-21 ENCOUNTER — Other Ambulatory Visit: Payer: Self-pay

## 2023-02-21 ENCOUNTER — Encounter (HOSPITAL_COMMUNITY): Payer: Self-pay

## 2023-02-21 ENCOUNTER — Emergency Department (HOSPITAL_COMMUNITY)
Admission: EM | Admit: 2023-02-21 | Discharge: 2023-02-21 | Disposition: A | Discharge status: Home / Self Care | Payer: Medicaid Other | Attending: Emergency Medicine | Admitting: Emergency Medicine

## 2023-02-21 DIAGNOSIS — R110 Nausea: Secondary | ICD-10-CM | POA: Insufficient documentation

## 2023-02-21 DIAGNOSIS — R1030 Lower abdominal pain, unspecified: Secondary | ICD-10-CM | POA: Diagnosis not present

## 2023-02-21 LAB — COMPREHENSIVE METABOLIC PANEL
ALT: 22 U/L (ref 0–44)
AST: 23 U/L (ref 15–41)
Albumin: 4.7 g/dL (ref 3.5–5.0)
Alkaline Phosphatase: 76 U/L (ref 38–126)
Anion gap: 10 (ref 5–15)
BUN: 9 mg/dL (ref 6–20)
CO2: 23 mmol/L (ref 22–32)
Calcium: 9.2 mg/dL (ref 8.9–10.3)
Chloride: 104 mmol/L (ref 98–111)
Creatinine, Ser: 0.78 mg/dL (ref 0.44–1.00)
GFR, Estimated: >60 mL/min (ref >60)
Glucose, Bld: 82 mg/dL (ref 70–99)
Potassium: 3.5 mmol/L (ref 3.5–5.1)
Sodium: 137 mmol/L (ref 135–145)
Total Bilirubin: 0.6 mg/dL (ref 0.3–1.2)
Total Protein: 7.7 g/dL (ref 6.5–8.1)

## 2023-02-21 LAB — URINALYSIS, ROUTINE W REFLEX MICROSCOPIC
Bilirubin Urine: NEGATIVE
Glucose, UA: NEGATIVE mg/dL
Hgb urine dipstick: NEGATIVE
Ketones, ur: 20 mg/dL — AB
Leukocytes,Ua: NEGATIVE
Nitrite: NEGATIVE
Protein, ur: NEGATIVE mg/dL
Specific Gravity, Urine: 1.021 (ref 1.005–1.030)
pH: 6 (ref 5.0–8.0)

## 2023-02-21 LAB — CBC
HCT: 38.2 % (ref 36.0–46.0)
Hemoglobin: 12.3 g/dL (ref 12.0–15.0)
MCH: 29 pg (ref 26.0–34.0)
MCHC: 32.2 g/dL (ref 30.0–36.0)
MCV: 90.1 fL (ref 80.0–100.0)
Platelets: 320 10*3/uL (ref 150–400)
RBC: 4.24 MIL/uL (ref 3.87–5.11)
RDW: 14.1 % (ref 11.5–15.5)
WBC: 16.1 10*3/uL — ABNORMAL HIGH (ref 4.0–10.5)
nRBC: 0 % (ref 0.0–0.2)

## 2023-02-21 LAB — PREGNANCY, URINE: Preg Test, Ur: NEGATIVE

## 2023-02-21 LAB — LIPASE, BLOOD: Lipase: 25 U/L (ref 11–51)

## 2023-02-21 MED ORDER — ONDANSETRON HCL 4 MG/2ML IJ SOLN
4.0000 mg | Freq: Once | INTRAMUSCULAR | Status: AC
  Administered 2023-02-21: 4 mg via INTRAVENOUS
  Filled 2023-02-21: qty 2

## 2023-02-21 NOTE — ED Provider Notes (Signed)
[place] EMERGENCY DEPARTMENT AT [facility] Provider Note   CSN: [id number] Arrival date & time: 02/21/23  0001     History  Chief Complaint  Patient presents with   Abdominal Pain    Samantha Long is a 22 y.o. female.  The history is provided by the patient.  Patient reports sudden onset of abdominal pain.  This occurred approximately 12 hours ago.  It is in her lower abdomen.  She reports nausea without any fevers or vomiting.  No constipation or diarrhea.  No dysuria, no vaginal bleeding or discharge.  She has never had this before.  No previous abdominal surgeries. She denies any exposure to STDs    Home Medications Prior to Admission medications   Not on File      Allergies    Amoxicillin    Review of Systems   Review of Systems  Constitutional:  Negative for fever.  Gastrointestinal:  Positive for abdominal pain and nausea. Negative for constipation, diarrhea and vomiting.  Genitourinary:  Negative for dyspareunia, dysuria, vaginal bleeding and vaginal discharge.    Physical Exam Updated Vital Signs BP 121/65   Pulse 71   Temp 98.3 F (36.8 C) (Oral)   Resp 14   Ht 1.6 m (5\' 3" )   Wt 81.6 kg   SpO2 100%   BMI 31.89 kg/m  Physical Exam CONSTITUTIONAL: Well developed/well nourished HEAD: Normocephalic/atraumatic EYES: EOMI ENMT: Mucous membranes moist NECK: supple no meningeal signs CV: S1/S2 noted, no murmurs/rubs/gallops noted LUNGS: Lungs are clear to auscultation bilaterally, no apparent distress ABDOMEN: soft, nontender, no rebound or guarding, bowel sounds noted throughout abdomen GU:no cva tenderness Patient declines pelvic NEURO: Pt is awake/alert/appropriate, moves all extremitiesx4.  No facial droop.   EXTREMITIES: pulses normal/equal, full ROM SKIN: warm, color normal PSYCH: no abnormalities of mood noted, alert and oriented to situation  ED Results / Procedures / Treatments   Labs (all labs ordered are listed, but only  abnormal results are displayed) Labs Reviewed  CBC - Abnormal; Notable for the following components:      Result Value   WBC 16.1 (*)    All other components within normal limits  URINALYSIS, ROUTINE W REFLEX MICROSCOPIC - Abnormal; Notable for the following components:   APPearance HAZY (*)    Ketones, ur 20 (*)    All other components within normal limits  LIPASE, BLOOD  COMPREHENSIVE METABOLIC PANEL  PREGNANCY, URINE    EKG EKG Interpretation  Date/Time:  Saturday February 21 2023 00:27:47 EDT Ventricular Rate:  66 PR Interval:  135 QRS Duration: 83 QT Interval:  398 QTC Calculation: 417 R Axis:   55 Text Interpretation: Sinus arrhythmia No previous ECGs available Confirmed by Ripley Fraise [phone]) on 02/21/2023 12:32:10 AM  Radiology No results found.  Procedures Procedures    Medications Ordered in ED Medications  ondansetron (ZOFRAN) injection 4 mg (4 mg Intravenous Given 02/21/23 0142)    ED Course/ Medical Decision Making/ A&P Clinical Course as of 02/21/23 0231  Sat Feb 21, 2023  0231 Patient reports feeling improved.  No vomiting.  Except for leukocytosis, labs are overall unremarkable.  Patient continues to decline pelvic exam. [DW]  0231 We discussed neck steps which included recommending CT abdomen pelvis for undifferentiated abdominal pain.  Patient reports she is now feeling improved and would like to defer all further workup.  I advised her that I am unable to rule out acute abdominal emergency without CT imaging, and she understands this and would  still like to be discharged home. [DW]  0231   We discussed strict ER return precautions including abdominal pain that migrates to RLQ, fever >100.68F with repetitive vomiting over next 8-12 hours [DW]    Clinical Course User Index [DW] Ripley Fraise, MD                             Medical Decision Making Amount and/or Complexity of Data Reviewed Labs: ordered.  Risk Prescription drug  management.   This patient presents to the ED for concern of abdominal pain, this involves an extensive number of treatment options, and is a complaint that carries with it a high risk of complications and morbidity.  The differential diagnosis includes but is not limited to cholecystitis, cholelithiasis, pancreatitis, gastritis, peptic ulcer disease, appendicitis, bowel obstruction, bowel perforation, diverticulitis, PID, tubo-ovarian abscess, ovarian torsion, ectopic pregnancy   Social Determinants of Health: Patient's impaired access to primary care  increases the complexity of managing their presentation  Additional history obtained: Records reviewed Care Everywhere/External Records  Lab Tests: I Ordered, and personally interpreted labs.  The pertinent results include: Leukocytosis  Medicines ordered and prescription drug management: I ordered medication including Zofran for nausea Reevaluation of the patient after these medicines showed that the patient    improved  Test Considered: I offered patient CT imaging but she declined  Reevaluation: After the interventions noted above, I reevaluated the patient and found that they have :improved  Complexity of problems addressed: Patient's presentation is most consistent with  acute presentation with potential threat to life or bodily function  Disposition: After consideration of the diagnostic results and the patient's response to treatment,  I feel that the patent would benefit from discharge   .           Final Clinical Impression(s) / ED Diagnoses Final diagnoses:  Lower abdominal pain    Rx / DC Orders ED Discharge Orders     None         Ripley Fraise, MD 02/21/23 [phone]

## 2023-02-21 NOTE — ED Triage Notes (Signed)
Lower abd pain x1 day. Denies N/V/D.

## 2023-02-21 NOTE — Discharge Instructions (Signed)

## 2023-06-08 DIAGNOSIS — Z113 Encounter for screening for infections with a predominantly sexual mode of transmission: Secondary | ICD-10-CM | POA: Diagnosis not present

## 2023-06-29 DIAGNOSIS — K0889 Other specified disorders of teeth and supporting structures: Secondary | ICD-10-CM | POA: Diagnosis not present

## 2023-08-29 ENCOUNTER — Emergency Department (HOSPITAL_COMMUNITY)
Admission: EM | Admit: 2023-08-29 | Discharge: 2023-08-29 | Disposition: A | Discharge status: Home / Self Care | Payer: 59 | Attending: Emergency Medicine | Admitting: Emergency Medicine

## 2023-08-29 ENCOUNTER — Other Ambulatory Visit: Payer: Self-pay

## 2023-08-29 DIAGNOSIS — K2900 Acute gastritis without bleeding: Secondary | ICD-10-CM | POA: Insufficient documentation

## 2023-08-29 DIAGNOSIS — R112 Nausea with vomiting, unspecified: Secondary | ICD-10-CM | POA: Diagnosis not present

## 2023-08-29 LAB — URINALYSIS, ROUTINE W REFLEX MICROSCOPIC
Bilirubin Urine: NEGATIVE
Glucose, UA: NEGATIVE mg/dL
Ketones, ur: NEGATIVE mg/dL
Leukocytes,Ua: NEGATIVE
Nitrite: NEGATIVE
Protein, ur: NEGATIVE mg/dL
Specific Gravity, Urine: 1.026 (ref 1.005–1.030)
pH: 5 (ref 5.0–8.0)

## 2023-08-29 LAB — COMPREHENSIVE METABOLIC PANEL
ALT: 24 U/L (ref 0–44)
AST: 23 U/L (ref 15–41)
Albumin: 4 g/dL (ref 3.5–5.0)
Alkaline Phosphatase: 76 U/L (ref 38–126)
Anion gap: 8 (ref 5–15)
BUN: 13 mg/dL (ref 6–20)
CO2: 24 mmol/L (ref 22–32)
Calcium: 8.9 mg/dL (ref 8.9–10.3)
Chloride: 104 mmol/L (ref 98–111)
Creatinine, Ser: 0.67 mg/dL (ref 0.44–1.00)
GFR, Estimated: >60 mL/min (ref >60)
Glucose, Bld: 87 mg/dL (ref 70–99)
Potassium: 3.6 mmol/L (ref 3.5–5.1)
Sodium: 136 mmol/L (ref 135–145)
Total Bilirubin: 0.9 mg/dL (ref 0.3–1.2)
Total Protein: 7.2 g/dL (ref 6.5–8.1)

## 2023-08-29 LAB — CBC
HCT: 37 % (ref 36.0–46.0)
Hemoglobin: 12.3 g/dL (ref 12.0–15.0)
MCH: 30.3 pg (ref 26.0–34.0)
MCHC: 33.2 g/dL (ref 30.0–36.0)
MCV: 91.1 fL (ref 80.0–100.0)
Platelets: 280 10*3/uL (ref 150–400)
RBC: 4.06 MIL/uL (ref 3.87–5.11)
RDW: 13.6 % (ref 11.5–15.5)
WBC: 10.3 10*3/uL (ref 4.0–10.5)
nRBC: 0 % (ref 0.0–0.2)

## 2023-08-29 LAB — LIPASE, BLOOD: Lipase: 24 U/L (ref 11–51)

## 2023-08-29 LAB — HCG, SERUM, QUALITATIVE: Preg, Serum: NEGATIVE

## 2023-08-29 MED ORDER — SUCRALFATE 1 G PO TABS: 1.0000 g | ORAL_TABLET | Freq: Four times a day (QID) | ORAL | 0 refills | Status: DC

## 2023-08-29 MED ORDER — PANTOPRAZOLE SODIUM 40 MG PO TBEC: 40.0000 mg | DELAYED_RELEASE_TABLET | Freq: Every day | ORAL | 0 refills | Status: DC

## 2023-08-29 NOTE — ED Triage Notes (Signed)
Onset of upper abdominal pain around 1700 associated with vomiting and diarrhea. Denies fevers.

## 2023-08-29 NOTE — ED Provider Notes (Signed)
[place] EMERGENCY DEPARTMENT AT [facility] Provider Note   CSN: [id number] Arrival date & time: 08/29/23  8469     History  Chief Complaint  Patient presents with   Abdominal Pain    Samantha Long is a 22 y.o. female.  22 year old female presents nausea vomiting x 1 day.  Patient uses marijuana daily.  Also has history of gastritis.  States that she had lots of dairy products was content to sit off her stomach.  Denies any fever or chills per emesis was nonbilious or bloody.  Denies any dark stools.  Does not take any medications daily for this.  Denies any use of alcohol or tobacco       Home Medications Prior to Admission medications   Not on File      Allergies    Amoxicillin    Review of Systems   Review of Systems  All other systems reviewed and are negative.   Physical Exam Updated Vital Signs BP 126/85 (BP Location: Left Arm)   Pulse (!) 55   Temp 97.7 F (36.5 C) (Oral)   Ht 1.575 m (5\' 2" )   Wt 74.8 kg   LMP 08/18/2023   SpO2 97%   BMI 30.18 kg/m  Physical Exam Vitals and nursing note reviewed.  Constitutional:      General: She is not in acute distress.    Appearance: Normal appearance. She is well-developed. She is not toxic-appearing.  HENT:     Head: Normocephalic and atraumatic.  Eyes:     General: Lids are normal.     Conjunctiva/sclera: Conjunctivae normal.     Pupils: Pupils are equal, round, and reactive to light.  Neck:     Thyroid: No thyroid mass.     Trachea: No tracheal deviation.  Cardiovascular:     Rate and Rhythm: Normal rate and regular rhythm.     Heart sounds: Normal heart sounds. No murmur heard.    No gallop.  Pulmonary:     Effort: Pulmonary effort is normal. No respiratory distress.     Breath sounds: Normal breath sounds. No stridor. No decreased breath sounds, wheezing, rhonchi or rales.  Abdominal:     General: There is no distension.     Palpations: Abdomen is soft.     Tenderness: There is no  abdominal tenderness. There is no rebound.  Musculoskeletal:        General: No tenderness. Normal range of motion.     Cervical back: Normal range of motion and neck supple.  Skin:    General: Skin is warm and dry.     Findings: No abrasion or rash.  Neurological:     Mental Status: She is alert and oriented to person, place, and time. Mental status is at baseline.     GCS: GCS eye subscore is 4. GCS verbal subscore is 5. GCS motor subscore is 6.     Cranial Nerves: Cranial nerves are intact. No cranial nerve deficit.     Sensory: No sensory deficit.     Motor: Motor function is intact.  Psychiatric:        Attention and Perception: Attention normal.        Speech: Speech normal.        Behavior: Behavior normal.     ED Results / Procedures / Treatments   Labs (all labs ordered are listed, but only abnormal results are displayed) Labs Reviewed  URINALYSIS, ROUTINE W REFLEX MICROSCOPIC - Abnormal; Notable for the following  components:      Result Value   APPearance HAZY (*)    Hgb urine dipstick SMALL (*)    Bacteria, UA RARE (*)    All other components within normal limits  LIPASE, BLOOD  COMPREHENSIVE METABOLIC PANEL  CBC  HCG, SERUM, QUALITATIVE    EKG None  Radiology No results found.  Procedures Procedures    Medications Ordered in ED Medications - No data to display  ED Course/ Medical Decision Making/ A&P                                 Medical Decision Making Amount and/or Complexity of Data Reviewed Labs: ordered.   Patient's labs are reassuring.  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  Possibly some gastritis as well 2.  No concern for gallbladder etiology.  No evidence of GI bleeding.  Will place on PPI and Carafate and patient counseled on use of THC        Final Clinical Impression(s) / ED Diagnoses Final diagnoses:  None    Rx / DC Orders ED Discharge Orders     None         Lorre Nick,  MD 08/29/23 [phone]

## 2023-10-26 ENCOUNTER — Emergency Department (HOSPITAL_COMMUNITY)
Admission: EM | Admit: 2023-10-26 | Discharge: 2023-10-26 | Disposition: A | Discharge status: Home / Self Care | Payer: 59 | Attending: Emergency Medicine | Admitting: Emergency Medicine

## 2023-10-26 ENCOUNTER — Other Ambulatory Visit: Payer: Self-pay

## 2023-10-26 ENCOUNTER — Encounter (HOSPITAL_COMMUNITY): Payer: Self-pay | Admitting: Emergency Medicine

## 2023-10-26 DIAGNOSIS — R350 Frequency of micturition: Secondary | ICD-10-CM | POA: Diagnosis not present

## 2023-10-26 DIAGNOSIS — N3000 Acute cystitis without hematuria: Secondary | ICD-10-CM | POA: Insufficient documentation

## 2023-10-26 LAB — URINALYSIS, ROUTINE W REFLEX MICROSCOPIC
Bilirubin Urine: NEGATIVE
Glucose, UA: NEGATIVE mg/dL
Ketones, ur: NEGATIVE mg/dL
Nitrite: NEGATIVE
Protein, ur: 30 mg/dL — AB
Specific Gravity, Urine: 1.024 (ref 1.005–1.030)
WBC, UA: >50 WBC/hpf (ref 0–5)
pH: 6 (ref 5.0–8.0)

## 2023-10-26 LAB — PREGNANCY, URINE: Preg Test, Ur: NEGATIVE

## 2023-10-26 MED ORDER — PHENAZOPYRIDINE HCL 200 MG PO TABS: 200.0000 mg | ORAL_TABLET | Freq: Three times a day (TID) | ORAL | 0 refills | Status: AC

## 2023-10-26 MED ORDER — SULFAMETHOXAZOLE-TRIMETHOPRIM 800-160 MG PO TABS
1.0000 | ORAL_TABLET | Freq: Once | ORAL | Status: AC
  Administered 2023-10-26: 1 via ORAL
  Filled 2023-10-26: qty 1

## 2023-10-26 MED ORDER — NAPROXEN 500 MG PO TABS
500.0000 mg | ORAL_TABLET | Freq: Once | ORAL | Status: AC
  Administered 2023-10-26: 500 mg via ORAL
  Filled 2023-10-26: qty 1

## 2023-10-26 MED ORDER — SULFAMETHOXAZOLE-TRIMETHOPRIM 800-160 MG PO TABS
1.0000 | ORAL_TABLET | Freq: Two times a day (BID) | ORAL | 0 refills | Status: AC
Start: 2023-10-26 — End: 2023-10-31

## 2023-10-26 NOTE — ED Provider Notes (Signed)
[name] EMERGENCY DEPARTMENT AT [facility] Provider Note   CSN: [id number] Arrival date & time: 10/26/23  1438     History  Chief Complaint  Patient presents with   Urinary Frequency    Samantha Long is a 22 y.o. female with no significant past medical history presents the ED today for urinary frequency.  Patient reports urinary frequency and dysuria for the past 4 days with associated suprapubic discomfort.  She took AZO prior to arrival. No fever, nausea, vomiting, or back pain.  No changes to bowel habits.  She reports history of urinary tract infections in the past and states that this feels similar.  Denies any vaginal pain or discharge.  No additional complaints or concerns at this time.    Home Medications Prior to Admission medications   Medication Sig Start Date End Date Taking? Authorizing Provider  phenazopyridine (PYRIDIUM) 200 MG tablet Take 1 tablet (200 mg total) by mouth 3 (three) times daily for 5 days. 10/26/23 10/31/23 Yes Maxwell Marion, PA-C  sulfamethoxazole-trimethoprim (BACTRIM DS) 800-160 MG tablet Take 1 tablet by mouth 2 (two) times daily for 5 days. 10/26/23 10/31/23 Yes Maxwell Marion, PA-C  pantoprazole (PROTONIX) 40 MG tablet Take 1 tablet (40 mg total) by mouth daily. 08/29/23   Lorre Nick, MD  sucralfate (CARAFATE) 1 g tablet Take 1 tablet (1 g total) by mouth 4 (four) times daily. 08/29/23   Lorre Nick, MD      Allergies    Amoxicillin    Review of Systems   Review of Systems  Genitourinary:  Positive for frequency.  All other systems reviewed and are negative.   Physical Exam Updated Vital Signs BP 120/63   Pulse 68   Temp 98 F (36.7 C) (Oral)   Resp 16   Ht 5\' 2"  (1.575 m)   Wt 73.5 kg   LMP 10/15/2023   SpO2 100%   BMI 29.63 kg/m  Physical Exam Vitals and nursing note reviewed.  Constitutional:      General: She is not in acute distress.    Appearance: Normal appearance.  HENT:     Head: Normocephalic and  atraumatic.     Mouth/Throat:     Mouth: Mucous membranes are moist.  Eyes:     Conjunctiva/sclera: Conjunctivae normal.     Pupils: Pupils are equal, round, and reactive to light.  Cardiovascular:     Rate and Rhythm: Normal rate and regular rhythm.     Pulses: Normal pulses.     Heart sounds: Normal heart sounds.  Pulmonary:     Effort: Pulmonary effort is normal.     Breath sounds: Normal breath sounds.  Abdominal:     Palpations: Abdomen is soft.     Tenderness: There is abdominal tenderness. There is no right CVA tenderness or left CVA tenderness.  Skin:    General: Skin is warm and dry.     Findings: No rash.  Neurological:     General: No focal deficit present.     Mental Status: She is alert.  Psychiatric:        Mood and Affect: Mood normal.        Behavior: Behavior normal.     ED Results / Procedures / Treatments   Labs (all labs ordered are listed, but only abnormal results are displayed) Labs Reviewed  URINALYSIS, ROUTINE W REFLEX MICROSCOPIC - Abnormal; Notable for the following components:      Result Value   APPearance HAZY (*)  Hgb urine dipstick SMALL (*)    Protein, ur 30 (*)    Leukocytes,Ua LARGE (*)    Bacteria, UA MANY (*)    All other components within normal limits  URINE CULTURE  PREGNANCY, URINE    EKG None  Radiology No results found.  Procedures Procedures: not indicated.   Medications Ordered in ED Medications  sulfamethoxazole-trimethoprim (BACTRIM DS) 800-160 MG per tablet 1 tablet (has no administration in time range)  naproxen (NAPROSYN) tablet 500 mg (500 mg Oral Given 10/26/23 1526)    ED Course/ Medical Decision Making/ A&P                                 Medical Decision Making Amount and/or Complexity of Data Reviewed Labs: ordered.  Risk Prescription drug management.   This patient presents to the ED for concern of dysuria, this involves an extensive number of treatment options, and is a complaint that  carries with it a high risk of complications and morbidity.   Differential diagnosis includes: UTI, pyelonephritis, pregnancy, etc.   Comorbidities  No significant past medical history   Additional History  Additional history obtained from prior records.   Lab Tests  I ordered and personally interpreted labs.  The pertinent results include:   UA shows many bacteria and large leukocytes, with a mod amount of squamous epithelial cells present.  Urine sent for culture. Negative pregnancy test.   Problem List / ED Course / Critical Interventions / Medication Management  Dysuria and frequency x4 days I ordered medications including: Naproxen for abdominal pain  First dose of Bactrim given in the ED today Reevaluation of the patient after these medicines showed that the patient improved I have reviewed the patients home medicines and have made adjustments as needed   Social Determinants of Health  Access to healthcare   Test / Admission - Considered  Discussed findings with patient.  All questions were answered. She is hemodynamically stable and safe for discharge home. Return precautions given.       Final Clinical Impression(s) / ED Diagnoses Final diagnoses:  Acute cystitis without hematuria    Rx / DC Orders ED Discharge Orders          Ordered    sulfamethoxazole-trimethoprim (BACTRIM DS) 800-160 MG tablet  2 times daily        10/26/23 1629    phenazopyridine (PYRIDIUM) 200 MG tablet  3 times daily        10/26/23 1629              Maxwell Marion, PA-C 10/26/23 1636    Jacalyn Lefevre, MD 10/26/23 [phone]

## 2023-10-26 NOTE — ED Triage Notes (Signed)
Patient arrives ambulatory by POV concerned for UTI. Reports frequent urination and pain after voiding onset of Friday.

## 2023-10-26 NOTE — Discharge Instructions (Signed)
As discussed, you have a UTI. Take Bactrim twice a day for the next 5 days.  Take Pyridium as needed for pain with urination.  You can take ibuprofen or have lower abdominal pains.  Follow-up with your PCP in the next 5 to 7 days for reevaluation of your symptoms.  Get help right away if: You have very bad back pain. You have very bad pain in your lower belly. You have a fever. You have chills. You feeling like you will vomit or you vomit.

## 2023-10-28 LAB — URINE CULTURE: Culture: >=100000 — AB

## 2023-10-29 ENCOUNTER — Telehealth (HOSPITAL_BASED_OUTPATIENT_CLINIC_OR_DEPARTMENT_OTHER): Payer: Self-pay

## 2023-10-29 NOTE — Telephone Encounter (Signed)
Post ED Visit - Positive Culture Follow-up: Successful Patient Follow-Up  Culture assessed and recommendations reviewed by:  []  Enzo Bi, Pharm.D. []  Celedonio Miyamoto, Pharm.D., BCPS AQ-ID []  Garvin Fila, Pharm.D., BCPS [x]  Georgina Pillion, Pharm.D., BCPS []  [place], [address].D., BCPS, AAHIVP []  Estella Husk, Pharm.D., BCPS, AAHIVP []  Lysle Pearl, PharmD, BCPS []  Phillips Climes, PharmD, BCPS []  Agapito Games, PharmD, BCPS []  Verlan Friends, PharmD  Positive urine culture  []  Patient discharged without antimicrobial prescription and treatment is now indicated [x]  Organism is resistant to prescribed ED discharge antimicrobial []  Patient with positive blood cultures  Changes discussed with ED provider: Theda Belfast, MD New antibiotic prescription Macrobid 100 mg po BID x 5 days Called to [facility] on [address]  Contacted patient, date 10/29/2023, time 10:15 am    Sandria Senter 10/29/2023, 10:16 AM

## 2023-10-29 NOTE — Progress Notes (Signed)
ED Antimicrobial Stewardship Positive Culture Follow Up   Samantha Long is an 22 y.o. female who presented to [facility] on 10/26/2023 with a chief complaint of  Chief Complaint  Patient presents with   Urinary Frequency    Recent Results (from the past 720 hour(s))  Urine Culture     Status: Abnormal   Collection Time: 10/26/23  2:46 PM   Specimen: Urine, Clean Catch  Result Value Ref Range Status   Specimen Description   Final    URINE, CLEAN CATCH Performed at [facility], [address] [place], [place] [address]    Special Requests   Final    NONE Performed at [facility], [address] [place], [place] [address]    Culture >=100,000 COLONIES/mL ESCHERICHIA COLI (A)  Final   Report Status 10/28/2023 FINAL  Final   Organism ID, Bacteria ESCHERICHIA COLI (A)  Final      Susceptibility   Escherichia coli - MIC*    AMPICILLIN <=2 SENSITIVE Sensitive     CEFAZOLIN <=4 SENSITIVE Sensitive     CEFEPIME <=0.12 SENSITIVE Sensitive     CEFTRIAXONE <=0.25 SENSITIVE Sensitive     CIPROFLOXACIN <=0.25 SENSITIVE Sensitive     GENTAMICIN <=1 SENSITIVE Sensitive     IMIPENEM <=0.25 SENSITIVE Sensitive     NITROFURANTOIN <=16 SENSITIVE Sensitive     TRIMETH/SULFA >=320 RESISTANT Resistant     AMPICILLIN/SULBACTAM <=2 SENSITIVE Sensitive     PIP/TAZO <=4 SENSITIVE Sensitive ug/mL    * >=100,000 COLONIES/mL ESCHERICHIA COLI    [x]  Treated with Bactrim, organism resistant to prescribed antimicrobial  New antibiotic prescription: Macrobid 100 mg po bid x 5 days, discontinue Bactrim  ED Provider: Theda Belfast, MD  Thank you for allowing pharmacy to be a part of this patient's care.  Georgina Pillion, PharmD, BCPS, BCIDP Infectious Diseases Clinical Pharmacist 10/29/2023 9:34 AM   **Pharmacist phone directory can now be found on [email or web address] ([email or web address]).  Listed under [facility].

## 2024-03-23 ENCOUNTER — Ambulatory Visit: Admission: EM | Admit: 2024-03-23 | Discharge: 2024-03-23 | Disposition: A | Discharge status: Home / Self Care

## 2024-03-23 ENCOUNTER — Encounter: Payer: Self-pay | Admitting: Emergency Medicine

## 2024-03-23 ENCOUNTER — Other Ambulatory Visit: Payer: Self-pay

## 2024-03-23 DIAGNOSIS — J069 Acute upper respiratory infection, unspecified: Secondary | ICD-10-CM | POA: Diagnosis not present

## 2024-03-23 DIAGNOSIS — J4521 Mild intermittent asthma with (acute) exacerbation: Secondary | ICD-10-CM

## 2024-03-23 LAB — POC COVID19/FLU A&B COMBO
Covid Antigen, POC: NEGATIVE
Influenza A Antigen, POC: NEGATIVE
Influenza B Antigen, POC: NEGATIVE

## 2024-03-23 MED ORDER — ALBUTEROL SULFATE HFA 108 (90 BASE) MCG/ACT IN AERS: 1.0000 | INHALATION_SPRAY | Freq: Four times a day (QID) | RESPIRATORY_TRACT | 0 refills | Status: DC | PRN

## 2024-03-23 MED ORDER — PREDNISONE 20 MG PO TABS
40.0000 mg | ORAL_TABLET | Freq: Every day | ORAL | 0 refills | Status: AC
Start: 2024-03-23 — End: 2024-03-28

## 2024-03-23 MED ORDER — AZELASTINE HCL 0.1 % NA SOLN
1.0000 | Freq: Two times a day (BID) | NASAL | 1 refills | Status: DC
Start: 2024-03-23 — End: 2024-08-30

## 2024-03-23 NOTE — ED Provider Notes (Signed)
 [facility]  Note:  This document was prepared using Conservation officer, historic buildings and may include unintentional dictation errors.  MRN: [id number] DOB: 2001-07-10  Subjective:   Samantha Long is a 23 y.o. female presenting for nasal congestion, ear pressure, wheezing, chest congestion usually worse at night.  Patient reports history of asthma but no current albuterol for therapy.  Denies any known sick contacts.  Patient has not taken any over-the-counter medication to treat symptoms.  No chest pain, weakness, dizziness at this time.  No current facility-administered medications for this encounter.  Current Outpatient Medications:    albuterol (VENTOLIN HFA) 108 (90 Base) MCG/ACT inhaler, Inhale 1-2 puffs into the lungs every 6 (six) hours as needed for wheezing or shortness of breath., Disp: 6.7 g, Rfl: 0   azelastine (ASTELIN) 0.1 % nasal spray, Place 1 spray into both nostrils 2 (two) times daily. Use in each nostril as directed, Disp: 30 mL, Rfl: 1   predniSONE (DELTASONE) 20 MG tablet, Take 2 tablets (40 mg total) by mouth daily for 5 days., Disp: 10 tablet, Rfl: 0   pantoprazole (PROTONIX) 40 MG tablet, Take 1 tablet (40 mg total) by mouth daily., Disp: 30 tablet, Rfl: 0   sucralfate (CARAFATE) 1 g tablet, Take 1 tablet (1 g total) by mouth 4 (four) times daily., Disp: 30 tablet, Rfl: 0   Allergies  Allergen Reactions   Amoxicillin Hives    Has patient had a PCN reaction causing immediate rash, facial/tongue/throat swelling, SOB or lightheadedness with hypotension: yes Has patient had a PCN reaction causing severe rash involving mucus membranes or skin necrosis: no Has patient had a PCN reaction that required hospitalization: ed visit Has patient had a PCN reaction occurring within the last 10 years: no If all of the above answers are "NO", then may proceed with Cephalosporin use.     Past Medical History:  Diagnosis Date   Chlamydia 09/24/2018   Gonorrhea  09/24/2018   Trichimoniasis 09/24/2018     History reviewed. No pertinent surgical history.  Family History  Problem Relation Age of Onset   Healthy Mother    Healthy Father     Social History   Tobacco Use   Smoking status: Never    Passive exposure: Yes   Smokeless tobacco: Never  Substance Use Topics   Alcohol use: No   Drug use: Yes    Types: Marijuana    Comment: daily    ROS Refer to HPI for ROS details.  Objective:   Vitals: BP 100/78 (BP Location: Left Arm)   Pulse 61   Temp 98.3 F (36.8 C) (Temporal)   Resp 20   LMP 03/01/2024   SpO2 99%   Physical Exam Vitals and nursing note reviewed.  Constitutional:      General: She is not in acute distress.    Appearance: She is well-developed. She is not ill-appearing or toxic-appearing.  HENT:     Head: Normocephalic and atraumatic.     Nose: Congestion and rhinorrhea present.     Mouth/Throat:     Mouth: Mucous membranes are moist.     Pharynx: No oropharyngeal exudate or posterior oropharyngeal erythema.  Eyes:     Extraocular Movements: Extraocular movements intact.     Conjunctiva/sclera: Conjunctivae normal.  Cardiovascular:     Rate and Rhythm: Normal rate and regular rhythm.     Heart sounds: No murmur heard. Pulmonary:     Effort: Pulmonary effort is normal. No respiratory distress.  Breath sounds: Normal breath sounds. No stridor. No decreased breath sounds, wheezing, rhonchi or rales.  Musculoskeletal:        General: Normal range of motion.  Skin:    General: Skin is warm and dry.  Neurological:     General: No focal deficit present.     Mental Status: She is alert and oriented to person, place, and time.  Psychiatric:        Mood and Affect: Mood normal.        Behavior: Behavior normal.     Procedures  Results for orders placed or performed during the hospital encounter of 03/23/24 (from the past 24 hours)  POC Covid19/Flu A&B Antigen     Status: Normal   Collection Time:  03/23/24  2:56 PM  Result Value Ref Range   Influenza A Antigen, POC Negative    Influenza B Antigen, POC Negative    Covid Antigen, POC Negative     No results found.   Assessment and Plan :     Discharge Instructions      1. Mild intermittent asthma with acute exacerbation (Primary) - predniSONE (DELTASONE) 20 MG tablet; Take 2 tablets (40 mg total) by mouth daily for 5 days.  Dispense: 10 tablet; Refill: 0 - albuterol (VENTOLIN HFA) 108 (90 Base) MCG/ACT inhaler; Inhale 1-2 puffs into the lungs every 6 (six) hours as needed for wheezing or shortness of breath.  Dispense: 6.7 g; Refill: 0  2. Viral URI - POC Covid19/Flu A&B Antigen completed in UC is negative for COVID and influenza. - azelastine (ASTELIN) 0.1 % nasal spray; Place 1 spray into both nostrils 2 (two) times daily. Use in each nostril as directed  Dispense: 30 mL; Refill: 1 -Continue to monitor symptoms for any change in severity if there is any escalation of current symptoms or development of new symptoms follow-up in ER for further evaluation and management.     Arby Dahir B Ashanty Coltrane   [place], [place], [place] 03/23/24 1504

## 2024-03-23 NOTE — Discharge Instructions (Addendum)
 1. Mild intermittent asthma with acute exacerbation (Primary) - predniSONE (DELTASONE) 20 MG tablet; Take 2 tablets (40 mg total) by mouth daily for 5 days.  Dispense: 10 tablet; Refill: 0 - albuterol (VENTOLIN HFA) 108 (90 Base) MCG/ACT inhaler; Inhale 1-2 puffs into the lungs every 6 (six) hours as needed for wheezing or shortness of breath.  Dispense: 6.7 g; Refill: 0  2. Viral URI - POC Covid19/Flu A&B Antigen completed in UC is negative for COVID and influenza. - azelastine (ASTELIN) 0.1 % nasal spray; Place 1 spray into both nostrils 2 (two) times daily. Use in each nostril as directed  Dispense: 30 mL; Refill: 1 -Continue to monitor symptoms for any change in severity if there is any escalation of current symptoms or development of new symptoms follow-up in ER for further evaluation and management.

## 2024-03-23 NOTE — ED Triage Notes (Signed)
 Patient presents to [facility] for evaluation of chest tightness and "difficulty getting air in" since this morning.  States her head hurts, she has bilateral ear pressure and nasal congestion.

## 2024-03-30 ENCOUNTER — Ambulatory Visit
Admission: EM | Admit: 2024-03-30 | Discharge: 2024-03-30 | Disposition: A | Discharge status: Home / Self Care | Attending: Emergency Medicine | Admitting: Emergency Medicine

## 2024-03-30 DIAGNOSIS — Z113 Encounter for screening for infections with a predominantly sexual mode of transmission: Secondary | ICD-10-CM | POA: Diagnosis present

## 2024-03-30 DIAGNOSIS — Z202 Contact with and (suspected) exposure to infections with a predominantly sexual mode of transmission: Secondary | ICD-10-CM | POA: Diagnosis not present

## 2024-03-30 LAB — POCT URINE PREGNANCY: Preg Test, Ur: NEGATIVE

## 2024-03-30 NOTE — Discharge Instructions (Signed)
 Your tests are pending.  If your test results are positive, we will call you.  You and your sexual partner(s) may require treatment at that time.  Do not have sexual activity for at least 7 days.

## 2024-03-30 NOTE — ED Provider Notes (Signed)
[name]    CSN: [id number] Arrival date & time: 03/30/24  1107      History   Chief Complaint Chief Complaint  Patient presents with   SEXUALLY TRANSMITTED DISEASE    HPI Samantha Long is a 23 y.o. female.  Patient presents with concern for possible exposure to an STD.  She heard through Facebook that a former sexual partner has HIV and/or HSV.  Patient is asymptomatic but requests STD testing, including blood work.  She denies rash, lesions, vaginal discharge, pelvic pain, abdominal pain, dysuria.  Patient was seen at this urgent care on 03/23/2024; diagnosed with asthma exacerbation and viral URI; negative flu and COVID; treated with prednisone , albuterol  inhaler, Astelin  nasal spray.  The history is provided by the patient and medical records.    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 No pertinent surgical history.  OB History   No obstetric history on file.      Home Medications    Prior to Admission medications   Medication Sig Start Date End Date Taking? Authorizing Provider  albuterol  (VENTOLIN  HFA) 108 (90 Base) MCG/ACT inhaler Inhale 1-2 puffs into the lungs every 6 (six) hours as needed for wheezing or shortness of breath. Patient not taking: Reported on 03/30/2024 03/23/24   Reddick, Johnathan B, NP  azelastine  (ASTELIN ) 0.1 % nasal spray Place 1 spray into both nostrils 2 (two) times daily. Use in each nostril as directed Patient not taking:  Reported on 03/30/2024 03/23/24   Reddick, Johnathan B, NP  pantoprazole  (PROTONIX ) 40 MG tablet Take 1 tablet (40 mg total) by mouth daily. Patient not taking: Reported on 03/30/2024 08/29/23   Lind Repine, MD  sucralfate  (CARAFATE ) 1 g tablet Take 1 tablet (1 g total) by mouth 4 (four) times daily. Patient not taking: Reported on 03/30/2024 08/29/23   Lind Repine, MD    Family History Family History  Problem Relation Age of Onset   Healthy Mother    Healthy Father     Social History Social History   Tobacco Use   Smoking status: Never    Passive exposure: Yes   Smokeless tobacco: Never  Substance Use Topics   Alcohol use: No   Drug use: Yes    Types: Marijuana    Comment: daily     Allergies   Amoxicillin   Review of Systems Review of Systems  Constitutional:  Negative for chills and fever.  Gastrointestinal:  Negative for abdominal pain, diarrhea and vomiting.  Genitourinary:  Negative for dysuria, hematuria, pelvic pain and vaginal discharge.  Skin:  Negative for color change, rash and wound.     Physical Exam Triage Vital Signs ED Triage Vitals  Encounter Vitals Group     BP 03/30/24 1124 108/71     Systolic BP Percentile --      Diastolic BP Percentile --      Pulse Rate 03/30/24 1124 72     Resp 03/30/24 1124 18     Temp 03/30/24 1124 97.8 F (  36.6 C)     Temp src --      SpO2 03/30/24 1124 98 %     Weight --      Height --      Head Circumference --      Peak Flow --      Pain Score 03/30/24 1122 0     Pain Loc --      Pain Education --      Exclude from Growth Chart --    No data found.  Updated Vital Signs BP 108/71   Pulse 72   Temp 97.8 F (36.6 C)   Resp 18   LMP 03/25/2024   SpO2 98%   Visual Acuity Right Eye Distance:   Left Eye Distance:   Bilateral Distance:    Right Eye Near:   Left Eye Near:    Bilateral Near:     Physical Exam Constitutional:      General: She is not in acute distress. HENT:     Mouth/Throat:      Mouth: Mucous membranes are moist.  Cardiovascular:     Rate and Rhythm: Normal rate and regular rhythm.     Heart sounds: Normal heart sounds.  Pulmonary:     Effort: Pulmonary effort is normal. No respiratory distress.     Breath sounds: Normal breath sounds.  Abdominal:     General: Bowel sounds are normal.     Palpations: Abdomen is soft.     Tenderness: There is no abdominal tenderness. There is no right CVA tenderness, left CVA tenderness, guarding or rebound.  Genitourinary:    Comments: Patient declines GU exam.  Neurological:     Mental Status: She is alert.      UC Treatments / Results  Labs (all labs ordered are listed, but only abnormal results are displayed) Labs Reviewed  RPR  HIV ANTIBODY (ROUTINE TESTING W REFLEX)  POCT URINE PREGNANCY  CERVICOVAGINAL ANCILLARY ONLY    EKG   Radiology No results found.  Procedures Procedures (including critical care time)  Medications Ordered in UC Medications - No data to display  Initial Impression / Assessment and Plan / UC Course  I have reviewed the triage vital signs and the nursing notes.  Pertinent labs & imaging results that were available during my care of the patient were reviewed by me and considered in my medical decision making (see chart for details).   STD screening, possible exposure to STD.  Patient obtained vaginal self swab for testing.  HIV and syphilis pending also.  Discussed that we will call if test results are positive.  Discussed that she may require treatment at that time.  Discussed that sexual partner(s) may also require treatment.  Instructed patient to abstain from sexual activity until all test results are back.  Instructed her to follow-up with her PCP or gynecologist as needed.  Patient agrees to plan of care.   Final Clinical Impressions(s) / UC Diagnoses   Final diagnoses:  Screening for STD (sexually transmitted disease)  Possible exposure to STD     Discharge  Instructions      Your tests are pending.  If your test results are positive, we will call you.  You and your sexual partner(s) may require treatment at that time.  Do not have sexual activity for at least 7 days.         ED Prescriptions   None    PDMP not reviewed this encounter.   Wellington Half, NP  03/30/24 1135  

## 2024-03-30 NOTE — ED Triage Notes (Signed)
 Patient to Urgent Care for STD testing.  Denies any symptoms. No known exposure.  Also requests blood work.

## 2024-03-31 ENCOUNTER — Telehealth (HOSPITAL_COMMUNITY): Payer: Self-pay

## 2024-03-31 LAB — CERVICOVAGINAL ANCILLARY ONLY
Bacterial Vaginitis (gardnerella): POSITIVE — AB
Candida Glabrata: NEGATIVE
Candida Vaginitis: NEGATIVE
Chlamydia: NEGATIVE
Comment: NEGATIVE
Comment: NEGATIVE
Comment: NEGATIVE
Comment: NEGATIVE
Comment: NEGATIVE
Comment: NORMAL
Neisseria Gonorrhea: NEGATIVE
Trichomonas: POSITIVE — AB

## 2024-03-31 LAB — RPR: RPR Ser Ql: NONREACTIVE

## 2024-03-31 LAB — HIV ANTIBODY (ROUTINE TESTING W REFLEX): HIV Screen 4th Generation wRfx: NONREACTIVE

## 2024-03-31 MED ORDER — METRONIDAZOLE 500 MG PO TABS: 500.0000 mg | ORAL_TABLET | Freq: Two times a day (BID) | ORAL | 0 refills | Status: AC

## 2024-03-31 NOTE — Telephone Encounter (Signed)
 Per protocol, pt requires tx with metronidazole. Reviewed with patient, verified pharmacy, prescription sent.

## 2024-05-17 ENCOUNTER — Encounter (HOSPITAL_COMMUNITY): Payer: Self-pay

## 2024-05-17 ENCOUNTER — Ambulatory Visit (HOSPITAL_COMMUNITY)
Admission: EM | Admit: 2024-05-17 | Discharge: 2024-05-17 | Disposition: A | Discharge status: Home / Self Care | Attending: Emergency Medicine | Admitting: Emergency Medicine

## 2024-05-17 DIAGNOSIS — N898 Other specified noninflammatory disorders of vagina: Secondary | ICD-10-CM

## 2024-05-17 NOTE — ED Triage Notes (Signed)
 Patient reports that she has had a white vaginal discharge x 4 days. Patient reports an intermittent sharp lower abdominal pain as well.

## 2024-05-17 NOTE — ED Provider Notes (Signed)
 [facility]    CSN: [id number] Arrival date & time: 05/17/24  1043      History   Chief Complaint Chief Complaint  Patient presents with   Vaginal Discharge    HPI Samantha Long is a 23 y.o. female.   23 year old female, Samantha Long, presents to urgent care for evaluation of white vaginal discharge for 4 days.  Patient reports intermittent sharp lower abdominal pain as well.  Patient denies any nausea vomiting, patient denies any dysuria.  LMP 04/21/2024 no possibility of pregnancy per patient report  The history is provided by the patient. No language interpreter was used.    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 No pertinent surgical history.  OB History   No obstetric history on file.      Home Medications    Prior to Admission medications   Medication Sig Start Date End Date Taking? Authorizing Provider  albuterol  (VENTOLIN  HFA) 108 (90 Base) MCG/ACT inhaler Inhale 1-2 puffs into the lungs every 6 (six) hours as needed for wheezing or shortness of breath. Patient not taking: Reported on 03/30/2024 03/23/24   Reddick, Johnathan B, NP  azelastine  (ASTELIN ) 0.1 % nasal spray Place 1 spray into both nostrils 2 (two) times daily. Use in each nostril as directed Patient not taking: Reported on 03/30/2024 03/23/24   Reddick, Johnathan B, NP  pantoprazole  (PROTONIX ) 40 MG tablet Take 1 tablet (40 mg total) by mouth  daily. Patient not taking: Reported on 03/30/2024 08/29/23   Lind Repine, MD  sucralfate  (CARAFATE ) 1 g tablet Take 1 tablet (1 g total) by mouth 4 (four) times daily. Patient not taking: Reported on 03/30/2024 08/29/23   Lind Repine, MD    Family History Family History  Problem Relation Age of Onset   Healthy Mother    Healthy Father     Social History Social History   Tobacco Use   Smoking status: Never    Passive exposure: Yes   Smokeless tobacco: Never  Vaping Use   Vaping status: Never Used  Substance Use Topics   Alcohol use: No   Drug use: Yes    Types: Marijuana    Comment: daily     Allergies   Amoxicillin   Review of Systems Review of Systems  Constitutional:  Negative for fever.  Gastrointestinal:  Positive for abdominal pain. Negative for diarrhea, nausea and vomiting.  Genitourinary:  Positive for vaginal discharge.  All other systems reviewed and are negative.    Physical Exam Triage Vital Signs ED Triage Vitals [05/17/24 1115]  Encounter Vitals Group     BP 107/71     Systolic BP Percentile      Diastolic BP Percentile      Pulse Rate 68     Resp 14     Temp 98.2 F (36.8 C)     Temp Source Oral     SpO2 98 %     Weight      Height  Head Circumference      Peak Flow      Pain Score 0     Pain Loc      Pain Education      Exclude from Growth Chart    No data found.  Updated Vital Signs BP 107/71 (BP Location: Right Arm)   Pulse 68   Temp 98.2 F (36.8 C) (Oral)   Resp 14   LMP 04/21/2024 (Exact Date)   SpO2 98%   Visual Acuity Right Eye Distance:   Left Eye Distance:   Bilateral Distance:    Right Eye Near:   Left Eye Near:    Bilateral Near:     Physical Exam Vitals and nursing note reviewed.  Constitutional:      General: She is not in acute distress.    Appearance: She is well-developed and well-groomed.  HENT:     Head: Normocephalic and atraumatic.  Eyes:     Conjunctiva/sclera: Conjunctivae normal.   Cardiovascular:     Rate and Rhythm: Normal rate.     Heart sounds: No murmur heard. Pulmonary:     Effort: Pulmonary effort is normal. No respiratory distress.  Abdominal:     Palpations: Abdomen is soft.     Tenderness: There is no abdominal tenderness. There is no guarding.     Comments: Deferred, patient self swabbed  Musculoskeletal:        General: No swelling.     Cervical back: Neck supple.  Skin:    General: Skin is warm and dry.     Capillary Refill: Capillary refill takes less than 2 seconds.  Neurological:     General: No focal deficit present.     Mental Status: She is alert and oriented to person, place, and time.     GCS: GCS eye subscore is 4. GCS verbal subscore is 5. GCS motor subscore is 6.  Psychiatric:        Mood and Affect: Mood normal.        Behavior: Behavior is cooperative.      UC Treatments / Results  Labs (all labs ordered are listed, but only abnormal results are displayed) Labs Reviewed  CERVICOVAGINAL ANCILLARY ONLY    EKG   Radiology No results found.  Procedures Procedures (including critical care time)  Medications Ordered in UC Medications - No data to display  Initial Impression / Assessment and Plan / UC Course  I have reviewed the triage vital signs and the nursing notes.  Pertinent labs & imaging results that were available during my care of the patient were reviewed by me and considered in my medical decision making (see chart for details).    Discussed exam findings and plan of care with patient, patient aware will be notified if results are positive and further medications indicated, strict go to ER precautions given.   Patient verbalized understanding to this provider.  Ddx: Vaginal discharge,STI, BV,yeast Final Clinical Impressions(s) / UC Diagnoses   Final diagnoses:  Vaginal discharge     Discharge Instructions      Avoid baths, hot tubs and whirlpool spas.  Don't use scented or harsh soaps, such as  those with deodorant or antibacterial action. Avoid irritants. These include scented tampons and pads. Wipe from front to back after using the toilet.  Don't douche. Your vagina doesn't require cleansing other than normal bathing.  Use a  condom. Wear cotton underwear, this fabric helps absorb moisture Avoid baths, hot tubs and whirlpool spas.  Don't use  scented or harsh soaps, such as those with deodorant or antibacterial action. Avoid irritants. These include scented tampons and pads. Wipe from front to back after using the toilet.  Don't douche. Your vagina doesn't require cleansing other than normal bathing.  Use a  condom. Wear cotton underwear, this fabric helps absorb moisture Avoid baths, hot tubs and whirlpool spas.  Don't use scented or harsh soaps, such as those with deodorant or antibacterial action. Avoid irritants. These include scented tampons and pads. Wipe from front to back after using the toilet.  Don't douche. Your vagina doesn't require cleansing other than normal bathing.  Use a  condom. Wear cotton underwear, this fabric helps absorb moisture  Check my chart for results. Avoid sexual activity until results,treatment known and completed. Safe sex with all future sexual activity. We have sent testing for sexually transmitted infections. We will notify you of any positive results once they are received. If required, we will prescribe any medications you might need. We will not notify you of negative results.   Follow up with PCP. Return as needed.  ED Prescriptions   None    PDMP not reviewed this encounter.   Peter Brands, NP 05/17/24 [id number]

## 2024-05-17 NOTE — Discharge Instructions (Signed)
 Avoid baths, hot tubs and whirlpool spas.  Don't use scented or harsh soaps, such as those with deodorant or antibacterial action. Avoid irritants. These include scented tampons and pads. Wipe from front to back after using the toilet.  Don't douche. Your vagina doesn't require cleansing other than normal bathing.  Use a  condom. Wear cotton underwear, this fabric helps absorb moisture Avoid baths, hot tubs and whirlpool spas.  Don't use scented or harsh soaps, such as those with deodorant or antibacterial action. Avoid irritants. These include scented tampons and pads. Wipe from front to back after using the toilet.  Don't douche. Your vagina doesn't require cleansing other than normal bathing.  Use a  condom. Wear cotton underwear, this fabric helps absorb moisture Avoid baths, hot tubs and whirlpool spas.  Don't use scented or harsh soaps, such as those with deodorant or antibacterial action. Avoid irritants. These include scented tampons and pads. Wipe from front to back after using the toilet.  Don't douche. Your vagina doesn't require cleansing other than normal bathing.  Use a  condom. Wear cotton underwear, this fabric helps absorb moisture  Check my chart for results. Avoid sexual activity until results,treatment known and completed. Safe sex with all future sexual activity. We have sent testing for sexually transmitted infections. We will notify you of any positive results once they are received. If required, we will prescribe any medications you might need. We will not notify you of negative results.   Follow up with PCP. Return as needed.

## 2024-05-18 ENCOUNTER — Ambulatory Visit (HOSPITAL_COMMUNITY): Payer: Self-pay

## 2024-05-18 LAB — CERVICOVAGINAL ANCILLARY ONLY
Bacterial Vaginitis (gardnerella): POSITIVE — AB
Candida Glabrata: NEGATIVE
Candida Vaginitis: NEGATIVE
Chlamydia: POSITIVE — AB
Comment: NEGATIVE
Comment: NEGATIVE
Comment: NEGATIVE
Comment: NEGATIVE
Comment: NEGATIVE
Comment: NORMAL
Neisseria Gonorrhea: NEGATIVE
Trichomonas: NEGATIVE

## 2024-05-18 MED ORDER — FLUCONAZOLE 150 MG PO TABS: 150.0000 mg | ORAL_TABLET | Freq: Once | ORAL | 0 refills | Status: AC

## 2024-05-18 MED ORDER — METRONIDAZOLE 500 MG PO TABS: 500.0000 mg | ORAL_TABLET | Freq: Two times a day (BID) | ORAL | 0 refills | Status: AC

## 2024-05-18 MED ORDER — DOXYCYCLINE HYCLATE 100 MG PO TABS
100.0000 mg | ORAL_TABLET | Freq: Two times a day (BID) | ORAL | 0 refills | Status: AC
Start: 2024-05-18 — End: 2024-05-25

## 2024-05-31 ENCOUNTER — Ambulatory Visit
Admission: EM | Admit: 2024-05-31 | Discharge: 2024-05-31 | Disposition: A | Discharge status: Home / Self Care | Attending: Family Medicine | Admitting: Family Medicine

## 2024-05-31 ENCOUNTER — Other Ambulatory Visit: Payer: Self-pay

## 2024-05-31 DIAGNOSIS — N3001 Acute cystitis with hematuria: Secondary | ICD-10-CM | POA: Diagnosis present

## 2024-05-31 DIAGNOSIS — N898 Other specified noninflammatory disorders of vagina: Secondary | ICD-10-CM | POA: Diagnosis present

## 2024-05-31 LAB — POCT URINALYSIS DIP (MANUAL ENTRY)
Glucose, UA: NEGATIVE mg/dL
Nitrite, UA: NEGATIVE
Spec Grav, UA: 1.025 (ref 1.010–1.025)
Urobilinogen, UA: 0.2 U/dL
pH, UA: 6 (ref 5.0–8.0)

## 2024-05-31 LAB — POCT URINE PREGNANCY: Preg Test, Ur: NEGATIVE

## 2024-05-31 MED ORDER — NITROFURANTOIN MONOHYD MACRO 100 MG PO CAPS
100.0000 mg | ORAL_CAPSULE | Freq: Two times a day (BID) | ORAL | 0 refills | Status: DC
Start: 2024-05-31 — End: 2024-08-30

## 2024-05-31 MED ORDER — FLUCONAZOLE 150 MG PO TABS
150.0000 mg | ORAL_TABLET | Freq: Every day | ORAL | 0 refills | Status: DC
Start: 2024-06-01 — End: 2024-08-30

## 2024-05-31 NOTE — ED Triage Notes (Signed)
 Pt c/o vaginal swelling, pink vaginal discharge and itchingx3d.

## 2024-05-31 NOTE — ED Provider Notes (Signed)
 [facility]    CSN: [id number] Arrival date & time: 05/31/24  1107      History   Chief Complaint No chief complaint on file.   HPI Samantha Long is a 23 y.o. female presents for vaginitis.  Patient reports 2 days of vaginal itching with discharge and some dysuria.  States area seems swollen but denies any rashes or lesions.  No fevers, nausea/vomiting, flank pain.  Patient was seen in urgent care on 6/10 following discharge and did test positive for BV and chlamydia.  She was treated with doxycycline  and metronidazole .  She was also given fluconazole  x 2 which she took one 3 days ago and 1 to 2 days ago without improvement.  Denies any current STD exposure.  States her symptoms feel more consistent with yeast.  No other concerns at this time.  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 No pertinent surgical history.  OB History   No obstetric history on file.      Home Medications    Prior to Admission medications   Medication Sig Start Date End Date Taking? Authorizing Provider  fluconazole  (DIFLUCAN ) 150 MG tablet Take 1 tablet (150 mg total) by mouth daily. Take 1 tablet on 6/25 and may repeat in 3 days if symptoms persist 06/01/24  Yes Loreda Myla SAUNDERS, NP  nitrofurantoin , macrocrystal-monohydrate, (MACROBID ) 100 MG capsule Take 1 capsule (100 mg total) by mouth 2 (two) times daily. 05/31/24  Yes Swayze Kozuch, Jodi R, NP  albuterol   (VENTOLIN  HFA) 108 (90 Base) MCG/ACT inhaler Inhale 1-2 puffs into the lungs every 6 (six) hours as needed for wheezing or shortness of breath. Patient not taking: Reported on 03/30/2024 03/23/24   Reddick, Johnathan B, NP  azelastine  (ASTELIN ) 0.1 % nasal spray Place 1 spray into both nostrils 2 (two) times daily. Use in each nostril as directed Patient not taking: Reported on 03/30/2024 03/23/24   Reddick, Johnathan B, NP  pantoprazole  (PROTONIX ) 40 MG tablet Take 1 tablet (40 mg total) by mouth daily. Patient not taking: Reported on 03/30/2024 08/29/23   Dasie Faden, MD  sucralfate  (CARAFATE ) 1 g tablet Take 1 tablet (1 g total) by mouth 4 (four) times daily. Patient not taking: Reported on 03/30/2024 08/29/23   Dasie Faden, MD    Family History Family History  Problem Relation Age of Onset   Healthy Mother    Healthy Father     Social History Social History   Tobacco Use   Smoking status: Never    Passive exposure: Yes   Smokeless tobacco: Never  Vaping Use   Vaping status: Never Used  Substance Use Topics   Alcohol use: No   Drug use: Yes    Types: Marijuana    Comment: daily     Allergies   Amoxicillin   Review of Systems Review of Systems  Genitourinary:  Positive for dysuria and vaginal discharge.     Physical Exam Triage Vital Signs ED Triage Vitals  Encounter Vitals Group     BP 05/31/24 1136 112/74     Girls Systolic BP Percentile --      Girls Diastolic BP Percentile --      Boys Systolic BP Percentile --      Boys Diastolic BP Percentile --      Pulse Rate 05/31/24 1136 62     Resp 05/31/24 1136 16     Temp 05/31/24 1136 97.6 F (36.4 C)     Temp Source 05/31/24 1136 Oral     SpO2 05/31/24 1136 98 %     Weight --      Height --      Head Circumference --      Peak Flow --      Pain Score 05/31/24 1134 5     Pain Loc --      Pain Education --      Exclude from Growth Chart --    No data found.  Updated Vital Signs BP 112/74   Pulse 62    Temp 97.6 F (36.4 C) (Oral)   Resp 16   LMP 05/22/2024 (Exact Date)   SpO2 98%   Visual Acuity Right Eye Distance:   Left Eye Distance:   Bilateral Distance:    Right Eye Near:   Left Eye Near:    Bilateral Near:     Physical Exam Vitals and nursing note reviewed.  Constitutional:      Appearance: Normal appearance.  HENT:     Head: Normocephalic and atraumatic.   Eyes:     Pupils: Pupils are equal, round, and reactive to light.    Cardiovascular:     Rate and Rhythm: Normal rate.  Pulmonary:     Effort: Pulmonary effort is normal.  Abdominal:     Tenderness: There is no right CVA tenderness or left CVA tenderness.   Skin:    General: Skin is warm and dry.   Neurological:     General: No focal deficit present.     Mental Status: She is alert and oriented to person, place, and time.   Psychiatric:        Mood and Affect: Mood normal.        Behavior: Behavior normal.      UC Treatments / Results  Labs (all labs ordered are listed, but only abnormal results are displayed) Labs Reviewed  POCT URINALYSIS DIP (MANUAL ENTRY) - Abnormal; Notable for the following components:      Result Value   Clarity, UA hazy (*)    Bilirubin, UA small (*)    Ketones, POC UA small (15) (*)    Blood, UA trace-lysed (*)    Protein Ur, POC trace (*)    Leukocytes, UA Small (1+) (*)    All other components within normal limits  URINE CULTURE  POCT URINE PREGNANCY  CERVICOVAGINAL ANCILLARY ONLY    EKG   Radiology No results found.  Procedures Procedures (including critical care time)  Medications Ordered in UC Medications - No data to display  Initial Impression / Assessment and Plan / UC Course  I have reviewed the triage vital signs and the nursing notes.  Pertinent labs & imaging results that were available during my care of the patient were reviewed by me and considered in my medical decision making (see chart for details).     Vaginal swab was ordered  and will contact for any positive results.  Discussed it is too early to rescreen for STDs  and will only check for BV and yeast and patient verbalized understanding.  Urine positive for UTI, will culture and start Macrobid .  Will do additional Diflucan , first dose to start tomorrow.  Repeat in 3 days if needed.  Discussed rest fluids and PCP or GYN follow-up as symptoms do not improve.  ER precautions reviewed. Final Clinical Impressions(s) / UC Diagnoses   Final diagnoses:  Vaginal discharge  Acute cystitis with hematuria     Discharge Instructions      The clinical contact you with results of the testing done today positive.  Start Macrobid  twice daily for 7 days.  Start Diflucan  tomorrow 6/25 and then you may repeat that dose 3 days later if you still have symptoms.  Lots of rest and fluids.  Please follow-up with your PCP or gynecologist if your symptoms do not improve.  Please go to the ER for any worsening symptoms.  Hope you feel better soon!     ED Prescriptions     Medication Sig Dispense Auth. Provider   nitrofurantoin , macrocrystal-monohydrate, (MACROBID ) 100 MG capsule Take 1 capsule (100 mg total) by mouth 2 (two) times daily. 14 capsule Ronia Hazelett, Jodi R, NP   fluconazole  (DIFLUCAN ) 150 MG tablet Take 1 tablet (150 mg total) by mouth daily. Take 1 tablet on 6/25 and may repeat in 3 days if symptoms persist 2 tablet Tamario Heal, Jodi R, NP      PDMP not reviewed this encounter.   Loreda Myla SAUNDERS, NP 05/31/24 1218

## 2024-05-31 NOTE — Discharge Instructions (Signed)
 The clinical contact you with results of the testing done today positive.  Start Macrobid  twice daily for 7 days.  Start Diflucan  tomorrow 6/25 and then you may repeat that dose 3 days later if you still have symptoms.  Lots of rest and fluids.  Please follow-up with your PCP or gynecologist if your symptoms do not improve.  Please go to the ER for any worsening symptoms.  Hope you feel better soon!

## 2024-06-01 ENCOUNTER — Ambulatory Visit (HOSPITAL_COMMUNITY): Payer: Self-pay

## 2024-06-01 LAB — CERVICOVAGINAL ANCILLARY ONLY
Bacterial Vaginitis (gardnerella): NEGATIVE
Candida Glabrata: NEGATIVE
Candida Vaginitis: POSITIVE — AB
Comment: NEGATIVE
Comment: NEGATIVE
Comment: NEGATIVE

## 2024-06-01 LAB — URINE CULTURE: Culture: >=100000 — AB

## 2024-06-24 ENCOUNTER — Telehealth: Admitting: Physician Assistant

## 2024-06-24 DIAGNOSIS — N898 Other specified noninflammatory disorders of vagina: Secondary | ICD-10-CM | POA: Diagnosis not present

## 2024-06-24 MED ORDER — METRONIDAZOLE 0.75 % VA GEL
1.0000 | Freq: Every day | VAGINAL | 0 refills | Status: DC
Start: 2024-06-24 — End: 2024-08-30

## 2024-06-24 NOTE — Patient Instructions (Signed)
 Samantha Long, thank you for joining Delon CHRISTELLA Dickinson, PA-C for today's virtual visit.  While this provider is not your primary care provider (PCP), if your PCP is located in our provider database this encounter information will be shared with them immediately following your visit.   A [facility] MyChart account gives you access to today's visit and all your visits, tests, and labs performed at [facility]  click here if you don't have a [facility] MyChart account or go to [email or web address]  Consent: (Patient) Samantha Long provided verbal consent for this virtual visit at the beginning of the encounter.  Current Medications:  Current Outpatient Medications:    metroNIDAZOLE  (METROGEL ) 0.75 % vaginal gel, Place 1 Applicatorful vaginally at bedtime., Disp: 70 g, Rfl: 0   albuterol  (VENTOLIN  HFA) 108 (90 Base) MCG/ACT inhaler, Inhale 1-2 puffs into the lungs every 6 (six) hours as needed for wheezing or shortness of breath. (Patient not taking: Reported on 03/30/2024), Disp: 6.7 g, Rfl: 0   azelastine  (ASTELIN ) 0.1 % nasal spray, Place 1 spray into both nostrils 2 (two) times daily. Use in each nostril as directed (Patient not taking: Reported on 03/30/2024), Disp: 30 mL, Rfl: 1   fluconazole  (DIFLUCAN ) 150 MG tablet, Take 1 tablet (150 mg total) by mouth daily. Take 1 tablet on 6/25 and may repeat in 3 days if symptoms persist, Disp: 2 tablet, Rfl: 0   nitrofurantoin , macrocrystal-monohydrate, (MACROBID ) 100 MG capsule, Take 1 capsule (100 mg total) by mouth 2 (two) times daily., Disp: 14 capsule, Rfl: 0   pantoprazole  (PROTONIX ) 40 MG tablet, Take 1 tablet (40 mg total) by mouth daily. (Patient not taking: Reported on 03/30/2024), Disp: 30 tablet, Rfl: 0   sucralfate  (CARAFATE ) 1 g tablet, Take 1 tablet (1 g total) by mouth 4 (four) times daily. (Patient not taking: Reported on 03/30/2024), Disp: 30 tablet, Rfl: 0   Medications ordered in this encounter:  Meds ordered this  encounter  Medications   metroNIDAZOLE  (METROGEL ) 0.75 % vaginal gel    Sig: Place 1 Applicatorful vaginally at bedtime.    Dispense:  70 g    Refill:  0    Supervising Provider:   BLAISE ALEENE KIDD [[id number]]     *If you need refills on other medications prior to your next appointment, please contact your pharmacy*  Follow-Up: Call back or seek an in-person evaluation if the symptoms worsen or if the condition fails to improve as anticipated.  [facility] [phone]  Other Instructions  Vaginal Infection (Bacterial Vaginosis): What to Know  Bacterial vaginosis is an infection of the vagina. It happens when the balance of normal germs (bacteria) in the vagina changes. It's common among females ages 74 to 38. If left untreated, it can increase your risk of getting a sexually transmitted infection (STI). If you're pregnant, you need to get treated right away. This infection can cause a baby to be born early or at a low birth weight. What are the causes? This happens when too many harmful germs grow in the vagina. The exact reason why this happens isn't known. You can't get this infection from toilet seats, bedding, swimming pools, or contact with objects around you. What increases the risk? Having new or multiple sexual partners, or unprotected sex. Douching. Using an intrauterine device (IUD). Smoking. Alcohol and drug abuse. Taking certain antibiotics. Being pregnant. You can get a vaginal infection without being sexually active. However, it most often occurs in sexually active females. What are  the signs or symptoms? Some females have no symptoms. If you have symptoms, they may include: [place] or white vaginal discharge. It can be watery or foamy. A fish-like smell, especially after sex or during your menstrual period. Itching in and around the vagina. Burning or pain with peeing. How is this diagnosed? This infection is diagnosed based on: Your medical  history. A physical exam of the vagina. Checking a sample of vaginal fluid for harmful bacteria or uncommon cells. How is this treated? This condition is treated with antibiotics. These may be given as: A pill. A cream for your vagina. A medicine that you put into your vagina called a suppository. If the infection comes back, you may need more antibiotics. Follow these instructions at home: Medicines Take your medicines only as told. Take or apply your antibiotics as told. Do not stop using them even if you start to feel better. General instructions If you have a female sexual partner, tell her about the infection. She should see her health care provider. Female partners don't need treatment. Avoid sex until treatment is complete. Drink more fluids as told. Keep the area around your vagina and rectum clean. Wash the area daily with warm water. Wipe yourself from front to back after pooping. If you're breastfeeding, talk to your provider about continuing during treatment. How is this prevented? Self-care Do not douche or use vaginal deodorant sprays. Douching can upset the balance of good and harmful bacteria in the vagina, which can cause an infection to happen again. Wear cotton or cotton-lined underwear. Avoid wearing tight pants or pantyhose, especially in the summer. Safe sex Use condoms correctly and every time you have sex. Use dental dams to protect yourself during oral sex. Limit the number of sexual partners. Get tested for STIs. Your sexual partner should also get tested. Drugs and alcohol Do not smoke, vape, or use nicotine or tobacco. Do not use drugs. Limit the amount of alcohol you drink because it can lead to risky sexual behavior. Where to find more information To learn more: Go to TonerPromos.no. Click Health Topics A-Z. Type bacterial vaginosis in the search box. American Sexual Health Association (ASHA): ashasexualhealth.org U.S. Department of Health and Lobbyist, Office on Women's Health: TravelLesson.ca Contact a health care provider if: Your symptoms don't get better, even after treatment. You have more discharge or pain when peeing. You have a fever or chills. You have pain in your belly or pelvis. You have pain during sex. You have vaginal bleeding between menstrual periods. This information is not intended to replace advice given to you by your health care provider. Make sure you discuss any questions you have with your health care provider. Document Revised: 05/13/2023 Document Reviewed: 05/13/2023 Elsevier Patient Education  2024 Elsevier Inc.   If you have been instructed to have an in-person evaluation today at a local Urgent Care facility, please use the link below. It will take you to a list of all of our available [facility], including address, phone number and hours of operation. Please do not delay care.  [facility]  If you or a family member do not have a primary care provider, use the link below to schedule a visit and establish care. When you choose a [facility] primary care physician or advanced practice provider, you gain a long-term partner in health. Find a Primary Care Provider  Learn more about [facility]'s in-office and virtual care options: [facility] - Get Care Now

## 2024-06-24 NOTE — Progress Notes (Signed)
 Virtual Visit Consent   Samantha Long, you are scheduled for a virtual visit with a [facility] provider today. Just as with appointments in the office, your consent must be obtained to participate. Your consent will be active for this visit and any virtual visit you may have with one of our providers in the next 365 days. If you have a MyChart account, a copy of this consent can be sent to you electronically.  As this is a virtual visit, video technology does not allow for your provider to perform a traditional examination. This may limit your provider's ability to fully assess your condition. If your provider identifies any concerns that need to be evaluated in person or the need to arrange testing (such as labs, EKG, etc.), we will make arrangements to do so. Although advances in technology are sophisticated, we cannot ensure that it will always work on either your end or our end. If the connection with a video visit is poor, the visit may have to be switched to a telephone visit. With either a video or telephone visit, we are not always able to ensure that we have a secure connection.  By engaging in this virtual visit, you consent to the provision of healthcare and authorize for your insurance to be billed (if applicable) for the services provided during this visit. Depending on your insurance coverage, you may receive a charge related to this service.  I need to obtain your verbal consent now. Are you willing to proceed with your visit today? Samantha Long has provided verbal consent on 06/24/2024 for a virtual visit (video or telephone). Samantha CHRISTELLA Dickinson, PA-C  Date: 06/24/2024 6:36 PM   Virtual Visit via Video Note   I, Samantha Long, connected with  Samantha Long  ([id number], 2001/10/26) on 06/24/24 at  6:30 PM EDT by a video-enabled telemedicine application and verified that I am speaking with the correct person using two identifiers.  Location: Patient: Virtual Visit Location  Patient: Home Provider: Virtual Visit Location Provider: Home Office   I discussed the limitations of evaluation and management by telemedicine and the availability of in person appointments. The patient expressed understanding and agreed to proceed.    History of Present Illness: Samantha Long is a 23 y.o. who identifies as a female who was assigned female at birth, and is being seen today for vaginal discharge.  HPI: Vaginal Discharge The patient's primary symptoms include a genital odor and vaginal discharge. This is a new problem. The current episode started 1 to 4 weeks ago (about a week ago). The problem has been waxing and waning. The pain is mild. She is not pregnant. Associated symptoms include abdominal pain (waxing and waning) and chills. Pertinent negatives include no constipation, diarrhea, dysuria, fever, hematuria or nausea. The vaginal discharge was bloody, brown, malodorous and thick. The vaginal bleeding is lighter than menses. She has not been passing clots. She has not been passing tissue. Nothing aggravates the symptoms. She has tried nothing for the symptoms. The treatment provided no relief.   She is having a brown discharged mixed with blood 2 weeks after a normal menstrual cycle. Denies any family or personal history of fibroids or ovarian cysts.   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    Medications:  Current Outpatient Medications:    metroNIDAZOLE  (METROGEL ) 0.75 % vaginal gel, Place 1 Applicatorful vaginally at bedtime., Disp: 70 g, Rfl: 0   albuterol  (VENTOLIN  HFA) 108 (90 Base) MCG/ACT inhaler, Inhale 1-2 puffs into the lungs every 6 (six) hours as needed for wheezing or shortness of breath. (Patient not taking: Reported on 03/30/2024), Disp: 6.7 g, Rfl: 0   azelastine  (ASTELIN ) 0.1 % nasal spray, Place 1 spray into both nostrils 2 (two) times daily. Use in each nostril as directed (Patient not taking: Reported on 03/30/2024), Disp: 30 mL, Rfl: 1   fluconazole  (DIFLUCAN ) 150 MG tablet, Take 1 tablet (150 mg total) by mouth daily. Take 1 tablet on 6/25 and may repeat in 3 days if symptoms persist, Disp: 2 tablet, Rfl: 0   nitrofurantoin , macrocrystal-monohydrate, (MACROBID ) 100 MG capsule, Take 1 capsule (100 mg total) by mouth 2 (two) times daily., Disp: 14 capsule, Rfl: 0   pantoprazole  (PROTONIX ) 40 MG tablet, Take 1 tablet (40 mg total) by mouth daily. (Patient not taking: Reported on 03/30/2024), Disp: 30 tablet, Rfl: 0   sucralfate  (CARAFATE ) 1 g tablet, Take 1 tablet (1 g total) by mouth 4 (four) times daily. (Patient not taking: Reported on 03/30/2024), Disp: 30 tablet, Rfl: 0  Observations/Objective: Patient is well-developed, well-nourished in no acute distress.  Resting comfortably at home.  Head is normocephalic, atraumatic.  No labored breathing.  Speech is clear and coherent with logical content.  Patient is alert and oriented at baseline.    Assessment and Plan: 1. Vaginal discharge (Primary) - metroNIDAZOLE  (METROGEL )  0.75 % vaginal gel; Place 1 Applicatorful vaginally at bedtime.  Dispense: 70 g; Refill: 0  - She is having a brown discharged mixed with blood 2 weeks after a normal menstrual cycle.  - Does report a foul, fishy odor - Will start Metrogel  as above - Advised if not improving in the next 48-72 hours she should be seen in person as this discharge is atypical for BV and may be a ruptured fibroid or other source - She voiced understanding and agrees to be seen in person if needed  Follow Up Instructions: I discussed the assessment and treatment plan with the patient. The patient was provided an opportunity to ask questions and all were answered. The patient agreed with the plan and demonstrated an understanding of the instructions.  A copy of instructions were sent to the patient via MyChart unless otherwise noted below.    The patient was advised to call back or seek an in-person evaluation if the symptoms worsen or if the condition fails to improve as anticipated.    Samantha CHRISTELLA Dickinson, PA-C

## 2024-07-05 ENCOUNTER — Encounter (HOSPITAL_COMMUNITY): Payer: Self-pay | Admitting: Emergency Medicine

## 2024-07-05 ENCOUNTER — Other Ambulatory Visit: Payer: Self-pay

## 2024-07-05 ENCOUNTER — Emergency Department (HOSPITAL_COMMUNITY)

## 2024-07-05 ENCOUNTER — Emergency Department (HOSPITAL_COMMUNITY)
Admission: EM | Admit: 2024-07-05 | Discharge: 2024-07-05 | Disposition: A | Discharge status: Home / Self Care | Attending: Emergency Medicine | Admitting: Emergency Medicine

## 2024-07-05 DIAGNOSIS — O209 Hemorrhage in early pregnancy, unspecified: Secondary | ICD-10-CM | POA: Diagnosis present

## 2024-07-05 DIAGNOSIS — O039 Complete or unspecified spontaneous abortion without complication: Secondary | ICD-10-CM | POA: Insufficient documentation

## 2024-07-05 LAB — CBC
HCT: 33.3 % — ABNORMAL LOW (ref 36.0–46.0)
Hemoglobin: 11 g/dL — ABNORMAL LOW (ref 12.0–15.0)
MCH: 30.9 pg (ref 26.0–34.0)
MCHC: 33 g/dL (ref 30.0–36.0)
MCV: 93.5 fL (ref 80.0–100.0)
Platelets: 268 K/uL (ref 150–400)
RBC: 3.56 MIL/uL — ABNORMAL LOW (ref 3.87–5.11)
RDW: 13.7 % (ref 11.5–15.5)
WBC: 9.2 K/uL (ref 4.0–10.5)
nRBC: 0 % (ref 0.0–0.2)

## 2024-07-05 LAB — HCG, SERUM, QUALITATIVE: Preg, Serum: POSITIVE — AB

## 2024-07-05 LAB — HCG, QUANTITATIVE, PREGNANCY: hCG, Beta Chain, Quant, S: 485 m[IU]/mL — ABNORMAL HIGH (ref <5)

## 2024-07-05 NOTE — Discharge Instructions (Signed)
 [facility] you to set up an appointment for repeat labs and imaging within the next 48 hours.

## 2024-07-05 NOTE — ED Triage Notes (Signed)
 Patient presents due to cramping attributed to her period. Period has lasted 2 weeks. She reports light bleeding unless she is active.

## 2024-07-05 NOTE — ED Provider Notes (Signed)
 Samantha Long EMERGENCY DEPARTMENT AT [facility] Provider Note   CSN: [id number] Arrival date & time: 07/05/24  1223     Patient presents with: Vaginal Bleeding and Pelvic Pain   Samantha Long is a 23 y.o. female who presents today with an approximately 2-week history of vaginal bleeding.  She also endorses having intermittent cramps.  Attributes current's bleeding to her.  Though states that this has been persistent bleeding over the last 2 weeks has been heavy flow on most days.  She has no previous pregnancies, denies having any purulent discharge, endorses cramping and tenderness in the pelvic region.  Denies any dysuria.    Vaginal Bleeding Pelvic Pain       Prior to Admission medications   Medication Sig Start Date End Date Taking? Authorizing Provider  albuterol  (VENTOLIN  HFA) 108 (90 Base) MCG/ACT inhaler Inhale 1-2 puffs into the lungs every 6 (six) hours as needed for wheezing or shortness of breath. Patient not taking: Reported on 03/30/2024 03/23/24   Reddick, Johnathan B, NP  azelastine  (ASTELIN ) 0.1 % nasal spray Place 1 spray into both nostrils 2 (two) times daily. Use in each nostril as directed Patient not taking: Reported on 03/30/2024 03/23/24   Reddick, Johnathan B, NP  fluconazole  (DIFLUCAN ) 150 MG tablet Take 1 tablet (150 mg total) by mouth daily. Take 1 tablet on 6/25 and may repeat in 3 days if symptoms persist 06/01/24   Mayer, Jodi R, NP  metroNIDAZOLE  (METROGEL ) 0.75 % vaginal gel Place 1 Applicatorful vaginally at bedtime. 06/24/24   Vivienne Delon HERO, PA-C  nitrofurantoin , macrocrystal-monohydrate, (MACROBID ) 100 MG capsule Take 1 capsule (100 mg total) by mouth 2 (two) times daily. 05/31/24   Mayer, Jodi R, NP  pantoprazole  (PROTONIX ) 40 MG tablet Take 1 tablet (40 mg total) by mouth daily. Patient not taking: Reported on 03/30/2024 08/29/23   Dasie Faden, MD  sucralfate  (CARAFATE ) 1 g tablet Take 1 tablet (1 g total) by mouth 4 (four) times  daily. Patient not taking: Reported on 03/30/2024 08/29/23   Dasie Faden, MD    Allergies: Amoxicillin    Review of Systems  Genitourinary:  Positive for pelvic pain and vaginal bleeding.  All other systems reviewed and are negative.   Updated Vital Signs BP 121/66 (BP Location: Right Arm)   Pulse 60   Temp 98.4 F (36.9 C) (Oral)   Resp 15   SpO2 100%   Physical Exam Vitals and nursing note reviewed.  Constitutional:      General: She is not in acute distress.    Appearance: Normal appearance.  HENT:     Head: Normocephalic and atraumatic.     Mouth/Throat:     Mouth: Mucous membranes are moist.     Pharynx: Oropharynx is clear.  Eyes:     Extraocular Movements: Extraocular movements intact.     Conjunctiva/sclera: Conjunctivae normal.     Pupils: Pupils are equal, round, and reactive to light.  Cardiovascular:     Rate and Rhythm: Normal rate and regular rhythm.     Pulses: Normal pulses.     Heart sounds: Normal heart sounds. No murmur heard.    No friction rub. No gallop.  Pulmonary:     Effort: Pulmonary effort is normal.     Breath sounds: Normal breath sounds.  Abdominal:     General: Abdomen is flat. Bowel sounds are normal.     Palpations: Abdomen is soft.     Tenderness: There is abdominal tenderness in the  periumbilical area and suprapubic area.  Musculoskeletal:        General: Normal range of motion.     Cervical back: Normal range of motion and neck supple.     Right lower leg: No edema.     Left lower leg: No edema.  Skin:    General: Skin is warm and dry.     Capillary Refill: Capillary refill takes less than 2 seconds.  Neurological:     General: No focal deficit present.     Mental Status: She is alert. Mental status is at baseline.  Psychiatric:        Mood and Affect: Mood normal.     (all labs ordered are listed, but only abnormal results are displayed) Labs Reviewed  CBC - Abnormal; Notable for the following components:       Result Value   RBC 3.56 (*)    Hemoglobin 11.0 (*)    HCT 33.3 (*)    All other components within normal limits  HCG, SERUM, QUALITATIVE - Abnormal; Notable for the following components:   Preg, Serum POSITIVE (*)    All other components within normal limits  HCG, QUANTITATIVE, PREGNANCY - Abnormal; Notable for the following components:   hCG, Beta Chain, Quant, S 485 (*)    All other components within normal limits    EKG: None  Radiology: US  OB Transvaginal Result Date: 07/05/2024 : CLINICAL DATA: Vaginal bleeding for 2 weeks. Quantitative beta HCG is 485. Estimated gestational age by LMP is 2 weeks 0 days. EXAM: OBSTETRIC <14 WK US  AND TRANSVAGINAL OB US  TECHNIQUE: Both transabdominal and transvaginal ultrasound examinations were performed for complete evaluation of the gestation as well as the maternal uterus, adnexal regions, and pelvic cul-de-sac. Transvaginal technique was performed to assess early pregnancy. COMPARISON: None Available. FINDINGS: Intrauterine gestational sac: None Yolk sac: Not Visualized. Embryo: Not Visualized. Cardiac Activity: Not Visualized. Maternal uterus/adnexae: Uterus is anteverted, measuring 7.2 x 3.4 x 4.1 cm. No myometrial mass lesions are identified. Endometrial stripe thickness is normal at 6 mm with homogeneous appearance. No focal lesions identified. Both ovaries are visualized. Right ovary measures 2.9 x 2.1 x 2.8 cm, 9 mL. Right ovary contains a hypoechoic area measuring 1.5 cm with peripheral flow suggesting corpus luteal cyst. Left ovary measures 2.2 x 1.5 x 1.5 cm, 3 mL. No abnormal adnexal masses are seen. Minimal free fluid. IMPRESSION: No intrauterine gestational sac, yolk sac, or fetal pole identified. Differential considerations include intrauterine pregnancy too early to be sonographically visualized, missed abortion, or ectopic pregnancy. Followup ultrasound is recommended in 10-14 days for further evaluation. Electronically Signed   By: Elsie Gravely M.D.   On: 07/05/2024 16:22   US  OB Comp < 14 Wks Result Date: 07/05/2024 CLINICAL DATA:  Vaginal bleeding for 2 weeks. Quantitative beta HCG is 485. Estimated gestational age by LMP is 2 weeks 0 days. EXAM: OBSTETRIC <14 WK US  AND TRANSVAGINAL OB US  TECHNIQUE: Both transabdominal and transvaginal ultrasound examinations were performed for complete evaluation of the gestation as well as the maternal uterus, adnexal regions, and pelvic cul-de-sac. Transvaginal technique was performed to assess early pregnancy. COMPARISON:  None Available. FINDINGS: Intrauterine gestational sac: None Yolk sac:  Not Visualized. Embryo:  Not Visualized. Cardiac Activity: Not Visualized. Maternal uterus/adnexae: Uterus is anteverted, measuring 7.2 x 3.4 x 4.1 cm. No myometrial mass lesions are identified. Endometrial stripe thickness is normal at 6 mm with homogeneous appearance. No focal lesions identified. Both ovaries are visualized. Right ovary  measures 2.9 x 2.1 x 2.8 cm, 9 mL. Right ovary contains a hypoechoic area measuring 1.5 cm with peripheral flow suggesting corpus luteal cyst. Left ovary measures 2.2 x 1.5 x 1.5 cm, 3 mL. No abnormal adnexal masses are seen. Minimal free fluid. IMPRESSION: No intrauterine gestational sac, yolk sac, or fetal pole identified. Differential considerations include intrauterine pregnancy too early to be sonographically visualized, missed abortion, or ectopic pregnancy. Followup ultrasound is recommended in 10-14 days for further evaluation. Electronically Signed   By: Elsie Gravely M.D.   On: 07/05/2024 16:13     Procedures   Medications Ordered in the ED - No data to display                                  Medical Decision Making Amount and/or Complexity of Data Reviewed Labs: ordered. Radiology: ordered.   Medical Decision Making:   Samantha Long is a 23 y.o. female who presented to the ED today with vaginal bleeding and pelvic cramping detailed above.      Complete initial physical exam performed, notably the patient  was alert and oriented in no apparent distress.  Notable exam finding of suprapubic tenderness as well as periumbilical tenderness..    Reviewed and confirmed nursing documentation for past medical history, family history, social history.    Initial Assessment:   With the patient's presentation of pelvic pain and vaginal bleeding, most likely diagnosis is incomplete spontaneous abortion.  Further differential diagnoses include endometriosis, leiomyoma.  Initial Plan:  Qualitative and quantitative hCG obtained Pelvic ultrasound obtained secondary to positive hCG. Screening labs including CBC and Metabolic panel to evaluate for infectious or metabolic etiology of disease.  Objective evaluation as below reviewed   Initial Study Results:   Laboratory  All laboratory results reviewed without evidence of clinically relevant pathology.   Exceptions include: Positive qualitative hCG     Radiology:  All images reviewed independently. Agree with radiology report at this time.   US  OB Transvaginal Result Date: 07/05/2024 : CLINICAL DATA: Vaginal bleeding for 2 weeks. Quantitative beta HCG is 485. Estimated gestational age by LMP is 2 weeks 0 days. EXAM: OBSTETRIC <14 WK US  AND TRANSVAGINAL OB US  TECHNIQUE: Both transabdominal and transvaginal ultrasound examinations were performed for complete evaluation of the gestation as well as the maternal uterus, adnexal regions, and pelvic cul-de-sac. Transvaginal technique was performed to assess early pregnancy. COMPARISON: None Available. FINDINGS: Intrauterine gestational sac: None Yolk sac: Not Visualized. Embryo: Not Visualized. Cardiac Activity: Not Visualized. Maternal uterus/adnexae: Uterus is anteverted, measuring 7.2 x 3.4 x 4.1 cm. No myometrial mass lesions are identified. Endometrial stripe thickness is normal at 6 mm with homogeneous appearance. No focal lesions identified. Both  ovaries are visualized. Right ovary measures 2.9 x 2.1 x 2.8 cm, 9 mL. Right ovary contains a hypoechoic area measuring 1.5 cm with peripheral flow suggesting corpus luteal cyst. Left ovary measures 2.2 x 1.5 x 1.5 cm, 3 mL. No abnormal adnexal masses are seen. Minimal free fluid. IMPRESSION: No intrauterine gestational sac, yolk sac, or fetal pole identified. Differential considerations include intrauterine pregnancy too early to be sonographically visualized, missed abortion, or ectopic pregnancy. Followup ultrasound is recommended in 10-14 days for further evaluation. Electronically Signed   By: Elsie Gravely M.D.   On: 07/05/2024 16:22   US  OB Comp < 14 Wks Result Date: 07/05/2024 CLINICAL DATA:  Vaginal bleeding for 2 weeks. Quantitative beta HCG  is 485. Estimated gestational age by LMP is 2 weeks 0 days. EXAM: OBSTETRIC <14 WK US  AND TRANSVAGINAL OB US  TECHNIQUE: Both transabdominal and transvaginal ultrasound examinations were performed for complete evaluation of the gestation as well as the maternal uterus, adnexal regions, and pelvic cul-de-sac. Transvaginal technique was performed to assess early pregnancy. COMPARISON:  None Available. FINDINGS: Intrauterine gestational sac: None Yolk sac:  Not Visualized. Embryo:  Not Visualized. Cardiac Activity: Not Visualized. Maternal uterus/adnexae: Uterus is anteverted, measuring 7.2 x 3.4 x 4.1 cm. No myometrial mass lesions are identified. Endometrial stripe thickness is normal at 6 mm with homogeneous appearance. No focal lesions identified. Both ovaries are visualized. Right ovary measures 2.9 x 2.1 x 2.8 cm, 9 mL. Right ovary contains a hypoechoic area measuring 1.5 cm with peripheral flow suggesting corpus luteal cyst. Left ovary measures 2.2 x 1.5 x 1.5 cm, 3 mL. No abnormal adnexal masses are seen. Minimal free fluid. IMPRESSION: No intrauterine gestational sac, yolk sac, or fetal pole identified. Differential considerations include intrauterine  pregnancy too early to be sonographically visualized, missed abortion, or ectopic pregnancy. Followup ultrasound is recommended in 10-14 days for further evaluation. Electronically Signed   By: Elsie Gravely M.D.   On: 07/05/2024 16:13        Reassessment and Plan:   After assessment of the patient and review of clinical data, seems that this patient may have a miscarriage as she has positive hCG, quantitative hCG indicating potential 2-week long pregnancy, ultrasound does not show any products of conception at this time.  Consulted with Dr. Alger with OB/GYN, they recommend that she be referred for reevaluation at their clinic in 48 hours.  Referral placed for same.  This is explained to the patient and she understands and agrees has no further concerns at this time.       Final diagnoses:  Miscarriage    ED Discharge Orders     None          Myriam Dorn BROCKS, [place] 07/05/24 2211    Pamella Ozell LABOR, DO 07/16/24 1642

## 2024-07-07 ENCOUNTER — Other Ambulatory Visit: Payer: Self-pay

## 2024-07-07 ENCOUNTER — Ambulatory Visit: Payer: Self-pay | Admitting: Family Medicine

## 2024-07-07 ENCOUNTER — Ambulatory Visit (INDEPENDENT_AMBULATORY_CARE_PROVIDER_SITE_OTHER): Admitting: *Deleted

## 2024-07-07 VITALS — BP 124/70 | HR 55 | Ht 62.0 in | Wt 143.4 lb

## 2024-07-07 DIAGNOSIS — O039 Complete or unspecified spontaneous abortion without complication: Secondary | ICD-10-CM

## 2024-07-07 DIAGNOSIS — Z3A00 Weeks of gestation of pregnancy not specified: Secondary | ICD-10-CM

## 2024-07-07 DIAGNOSIS — O3680X0 Pregnancy with inconclusive fetal viability, not applicable or unspecified: Secondary | ICD-10-CM

## 2024-07-07 LAB — BETA HCG QUANT (REF LAB): hCG Quant: 222 m[IU]/mL

## 2024-07-07 NOTE — Progress Notes (Signed)
 Here for stat bhcg. Reports she is still having light bleeding like a light period and mild cramping. Explained we will draw stat bhcg and then I will call her in a few hour with results and plan of care. She voices understanding. Rock Skip PEAK 2:45 bhcg results received and reveiwed with Dr. Lola who advised appears it is likely a miscarriage. He recommends weekly bhcg until returns to 0. I called Samantha Long and reviewed results and plan of care. She agreed to lab appointment for non stat bhcg 07/14/24. I also offered sab follow up which she would like. I explained I will message front office to schedule with her. She voices understanding.  Rock Skip PEAK

## 2024-07-14 ENCOUNTER — Other Ambulatory Visit

## 2024-07-14 ENCOUNTER — Other Ambulatory Visit: Payer: Self-pay

## 2024-07-14 DIAGNOSIS — O039 Complete or unspecified spontaneous abortion without complication: Secondary | ICD-10-CM

## 2024-07-15 LAB — BETA HCG QUANT (REF LAB): hCG Quant: 47 m[IU]/mL

## 2024-07-18 ENCOUNTER — Telehealth: Payer: Self-pay | Admitting: Family Medicine

## 2024-07-18 NOTE — Telephone Encounter (Signed)
 Pt left message on nurse line returning call from front office staff.  Needs to be contacted for scheduling.   [place], RN

## 2024-07-18 NOTE — Telephone Encounter (Signed)
 Called patient to schedule SAB but the patient did not answer. I left a voice mail asking patient to call us  back to book her appointment at her earliest convenience.

## 2024-07-19 ENCOUNTER — Telehealth: Payer: Self-pay

## 2024-07-19 NOTE — Telephone Encounter (Addendum)
-----   Message from [name] sent at 07/19/2024  2:34 PM EDT ----- Downtrending appropriately Clinical staff please coordinate follow up hcg in one week, trend to 0 ----- Message ----- From: Interface, [facility] Lab Results In Sent: 07/15/2024   4:36 AM EDT To: [name], MD  Pt notified and agreed to lab only appt on 07/21/24 at 115.    [name],RN  07/19/24

## 2024-07-21 ENCOUNTER — Other Ambulatory Visit

## 2024-07-21 ENCOUNTER — Other Ambulatory Visit: Payer: Self-pay

## 2024-07-21 DIAGNOSIS — O039 Complete or unspecified spontaneous abortion without complication: Secondary | ICD-10-CM

## 2024-07-22 ENCOUNTER — Ambulatory Visit: Payer: Self-pay | Admitting: Family Medicine

## 2024-07-22 DIAGNOSIS — O039 Complete or unspecified spontaneous abortion without complication: Secondary | ICD-10-CM

## 2024-07-22 LAB — BETA HCG QUANT (REF LAB): hCG Quant: 10 m[IU]/mL

## 2024-07-25 NOTE — Telephone Encounter (Signed)
-----   Message from Samantha Long sent at 07/22/2024 11:09 AM EDT ----- Downtrending appropriately Clinical staff please coordinate follow up hcg in one week, trend to 0 ----- Message ----- From: Interface, [facility] Lab Results In Sent: 07/22/2024   4:36 AM EDT To: Samantha CHRISTELLA Carolus, MD

## 2024-07-25 NOTE — Telephone Encounter (Signed)
 I called Samantha Long and notified her of results and recommendation. She agreed to lab appointment 8/ 21/25 at 11:00. She voices understanding. Rock Skip PEAK

## 2024-07-28 ENCOUNTER — Other Ambulatory Visit

## 2024-07-28 NOTE — Telephone Encounter (Signed)
 Opened in error

## 2024-07-29 ENCOUNTER — Other Ambulatory Visit

## 2024-07-29 ENCOUNTER — Other Ambulatory Visit: Payer: Self-pay

## 2024-07-29 DIAGNOSIS — O039 Complete or unspecified spontaneous abortion without complication: Secondary | ICD-10-CM

## 2024-07-30 LAB — BETA HCG QUANT (REF LAB): hCG Quant: 2 m[IU]/mL

## 2024-08-01 ENCOUNTER — Ambulatory Visit: Payer: Self-pay | Admitting: Family Medicine

## 2024-08-19 ENCOUNTER — Encounter (HOSPITAL_COMMUNITY): Payer: Self-pay

## 2024-08-19 ENCOUNTER — Ambulatory Visit (HOSPITAL_COMMUNITY)
Admission: EM | Admit: 2024-08-19 | Discharge: 2024-08-19 | Disposition: A | Discharge status: Home / Self Care | Attending: Emergency Medicine | Admitting: Emergency Medicine

## 2024-08-19 DIAGNOSIS — R112 Nausea with vomiting, unspecified: Secondary | ICD-10-CM | POA: Diagnosis not present

## 2024-08-19 DIAGNOSIS — N3000 Acute cystitis without hematuria: Secondary | ICD-10-CM | POA: Diagnosis not present

## 2024-08-19 DIAGNOSIS — Z3202 Encounter for pregnancy test, result negative: Secondary | ICD-10-CM

## 2024-08-19 DIAGNOSIS — N898 Other specified noninflammatory disorders of vagina: Secondary | ICD-10-CM

## 2024-08-19 LAB — POCT URINALYSIS DIP (MANUAL ENTRY)
Bilirubin, UA: NEGATIVE
Blood, UA: NEGATIVE
Glucose, UA: NEGATIVE mg/dL
Ketones, POC UA: NEGATIVE mg/dL
Nitrite, UA: NEGATIVE
Protein Ur, POC: 30 mg/dL — AB
Spec Grav, UA: >=1.03 — AB (ref 1.010–1.025)
Urobilinogen, UA: 0.2 U/dL
pH, UA: 6 (ref 5.0–8.0)

## 2024-08-19 LAB — POCT URINE PREGNANCY: Preg Test, Ur: NEGATIVE

## 2024-08-19 MED ORDER — ONDANSETRON 4 MG PO TBDP
ORAL_TABLET | ORAL | Status: AC
  Filled 2024-08-19: qty 1

## 2024-08-19 MED ORDER — SULFAMETHOXAZOLE-TRIMETHOPRIM 800-160 MG PO TABS: 1.0000 | ORAL_TABLET | Freq: Two times a day (BID) | ORAL | 0 refills | Status: AC

## 2024-08-19 MED ORDER — ONDANSETRON 4 MG PO TBDP
4.0000 mg | ORAL_TABLET | Freq: Once | ORAL | Status: AC
  Administered 2024-08-19: 4 mg via ORAL

## 2024-08-19 MED ORDER — ONDANSETRON HCL 4 MG PO TABS: 4.0000 mg | ORAL_TABLET | Freq: Four times a day (QID) | ORAL | 0 refills | Status: DC

## 2024-08-19 NOTE — ED Triage Notes (Signed)
 Pt states vomiting and chills since this morning.

## 2024-08-19 NOTE — Discharge Instructions (Signed)
 Follow-up with OB/GYN Full dose of antibiotics with food Take nausea medicine just as needed

## 2024-08-19 NOTE — ED Provider Notes (Signed)
 [facility]    CSN: [id number] Arrival date & time: 08/19/24  1218      History   Chief Complaint Chief Complaint  Patient presents with   Vomiting    HPI Samantha Long is a 23 y.o. female.   Patient presents today with some lower abdominal pain vomiting and chills this morning.  Patient states that she does have some slight vaginal discharge.  Denies any known fevers.  Has not taken anything prior to arrival last menstrual period was approximately 2 weeks ago.     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 No pertinent surgical history.  OB History   No obstetric history on file.      Home Medications    Prior to Admission medications   Medication Sig Start Date End Date Taking? Authorizing Provider  sulfamethoxazole -trimethoprim  (BACTRIM  DS) 800-160 MG tablet Take 1 tablet by mouth 2 (two) times daily for 7 days. 08/19/24 08/26/24 Yes Merilee Andrea CROME, NP  albuterol  (VENTOLIN  HFA) 108 (90 Base) MCG/ACT inhaler Inhale 1-2 puffs into the lungs every 6 (six) hours as needed for wheezing or shortness of breath. 03/23/24   Reddick, Johnathan B, NP  azelastine  (ASTELIN ) 0.1 % nasal spray Place 1 spray into both nostrils 2 (two) times daily. Use in each nostril as directed Patient not taking: Reported on 07/07/2024 03/23/24   Reddick, Johnathan B, NP  fluconazole  (DIFLUCAN ) 150 MG tablet Take 1 tablet (150 mg total) by mouth daily. Take  1 tablet on 6/25 and may repeat in 3 days if symptoms persist 06/01/24   Mayer, Jodi R, NP  metroNIDAZOLE  (METROGEL ) 0.75 % vaginal gel Place 1 Applicatorful vaginally at bedtime. 06/24/24   Vivienne Delon HERO, PA-C  nitrofurantoin , macrocrystal-monohydrate, (MACROBID ) 100 MG capsule Take 1 capsule (100 mg total) by mouth 2 (two) times daily. 05/31/24   Mayer, Jodi R, NP  pantoprazole  (PROTONIX ) 40 MG tablet Take 1 tablet (40 mg total) by mouth daily. Patient not taking: Reported on 03/30/2024 08/29/23   Dasie Faden, MD  sucralfate  (CARAFATE ) 1 g tablet Take 1 tablet (1 g total) by mouth 4 (four) times daily. Patient not taking: Reported on 03/30/2024 08/29/23   Dasie Faden, MD    Family History Family History  Problem Relation Age of Onset   Healthy Mother    Healthy Father     Social History Social History   Tobacco Use   Smoking status: Never    Passive exposure: Yes   Smokeless tobacco: Never  Vaping Use   Vaping status: Never Used  Substance Use Topics   Alcohol use: No   Drug use: Yes    Types: Marijuana    Comment: daily     Allergies   Amoxicillin   Review of Systems Review of Systems  Constitutional:  Positive for chills.  Respiratory: Negative.    Cardiovascular: Negative.   Gastrointestinal:  Positive for abdominal pain, nausea and vomiting. Negative for diarrhea.  Genitourinary:  Positive for vaginal  discharge. Negative for vaginal bleeding.  Neurological: Negative.      Physical Exam Triage Vital Signs ED Triage Vitals  Encounter Vitals Group     BP 08/19/24 1314 114/63     Girls Systolic BP Percentile --      Girls Diastolic BP Percentile --      Boys Systolic BP Percentile --      Boys Diastolic BP Percentile --      Pulse Rate 08/19/24 1314 71     Resp 08/19/24 1314 16     Temp 08/19/24 1314 97.9 F (36.6 C)     Temp Source 08/19/24 1314 Oral     SpO2 08/19/24 1314 98 %     Weight --      Height --      Head Circumference --      Peak  Flow --      Pain Score 08/19/24 1313 0     Pain Loc --      Pain Education --      Exclude from Growth Chart --    No data found.  Updated Vital Signs BP 114/63 (BP Location: Left Arm)   Pulse 71   Temp 97.9 F (36.6 C) (Oral)   Resp 16   LMP 08/11/2024 (Exact Date)   SpO2 98%   Visual Acuity Right Eye Distance:   Left Eye Distance:   Bilateral Distance:    Right Eye Near:   Left Eye Near:    Bilateral Near:     Physical Exam Constitutional:      Appearance: Normal appearance.  Cardiovascular:     Rate and Rhythm: Normal rate.  Pulmonary:     Effort: Pulmonary effort is normal.  Abdominal:     General: Abdomen is flat. Bowel sounds are normal.     Tenderness: There is right CVA tenderness and left CVA tenderness.  Neurological:     Mental Status: She is alert.      UC Treatments / Results  Labs (all labs ordered are listed, but only abnormal results are displayed) Labs Reviewed  POCT URINALYSIS DIP (MANUAL ENTRY) - Abnormal; Notable for the following components:      Result Value   Clarity, UA turbid (*)    Spec Grav, UA >=1.030 (*)    Protein Ur, POC =30 (*)    Leukocytes, UA Trace (*)    All other components within normal limits  POCT URINE PREGNANCY    EKG   Radiology No results found.  Procedures Procedures (including critical care time)  Medications Ordered in UC Medications  ondansetron  (ZOFRAN -ODT) disintegrating tablet 4 mg (4 mg Oral Given 08/19/24 1412)    Initial Impression / Assessment and Plan / UC Course  I have reviewed the triage vital signs and the nursing notes.  Pertinent labs & imaging results that were available during my care of the patient were reviewed by me and considered in my medical decision making (see chart for details).     Urine shows a slight possible UTI Start antibiotics and take with food Take nausea medicine as needed Patient did have a miscarriage approximately 6 weeks ago has been follow-up with her  OB/GYN.  She will follow-up with them in approximately 2 weeks They hydrated drink plenty of fluids Final Clinical Impressions(s) / UC Diagnoses   Final diagnoses:  Nausea and vomiting, unspecified vomiting type  Vaginal discharge  Acute cystitis without hematuria     Discharge Instructions      Follow-up  with OB/GYN Full dose of antibiotics with food Take nausea medicine just as needed     ED Prescriptions     Medication Sig Dispense Auth. Provider   sulfamethoxazole -trimethoprim  (BACTRIM  DS) 800-160 MG tablet Take 1 tablet by mouth 2 (two) times daily for 7 days. 14 tablet Merilee Andrea CROME, NP      PDMP not reviewed this encounter.   Merilee Andrea CROME, NP 08/19/24 1433

## 2024-08-27 ENCOUNTER — Inpatient Hospital Stay (HOSPITAL_COMMUNITY)
Admission: EM | Admit: 2024-08-27 | Discharge: 2024-08-30 | DRG: 813 | Disposition: A | Discharge status: Home / Self Care | Attending: Internal Medicine | Admitting: Internal Medicine

## 2024-08-27 DIAGNOSIS — Z8619 Personal history of other infectious and parasitic diseases: Secondary | ICD-10-CM

## 2024-08-27 DIAGNOSIS — Z880 Allergy status to penicillin: Secondary | ICD-10-CM

## 2024-08-27 DIAGNOSIS — T378X5A Adverse effect of other specified systemic anti-infectives and antiparasitics, initial encounter: Secondary | ICD-10-CM | POA: Diagnosis present

## 2024-08-27 DIAGNOSIS — D696 Thrombocytopenia, unspecified: Secondary | ICD-10-CM | POA: Diagnosis present

## 2024-08-27 DIAGNOSIS — D693 Immune thrombocytopenic purpura: Principal | ICD-10-CM | POA: Diagnosis present

## 2024-08-27 DIAGNOSIS — R21 Rash and other nonspecific skin eruption: Secondary | ICD-10-CM | POA: Diagnosis not present

## 2024-08-27 DIAGNOSIS — Z79899 Other long term (current) drug therapy: Secondary | ICD-10-CM

## 2024-08-27 DIAGNOSIS — D692 Other nonthrombocytopenic purpura: Secondary | ICD-10-CM | POA: Diagnosis present

## 2024-08-27 DIAGNOSIS — D6959 Other secondary thrombocytopenia: Secondary | ICD-10-CM | POA: Diagnosis present

## 2024-08-27 DIAGNOSIS — Z87440 Personal history of urinary (tract) infections: Secondary | ICD-10-CM

## 2024-08-28 ENCOUNTER — Encounter (HOSPITAL_COMMUNITY): Payer: Self-pay

## 2024-08-28 ENCOUNTER — Other Ambulatory Visit: Payer: Self-pay

## 2024-08-28 DIAGNOSIS — Z87440 Personal history of urinary (tract) infections: Secondary | ICD-10-CM | POA: Diagnosis not present

## 2024-08-28 DIAGNOSIS — R21 Rash and other nonspecific skin eruption: Secondary | ICD-10-CM | POA: Diagnosis present

## 2024-08-28 DIAGNOSIS — Z79899 Other long term (current) drug therapy: Secondary | ICD-10-CM | POA: Diagnosis not present

## 2024-08-28 DIAGNOSIS — T50905A Adverse effect of unspecified drugs, medicaments and biological substances, initial encounter: Secondary | ICD-10-CM

## 2024-08-28 DIAGNOSIS — D692 Other nonthrombocytopenic purpura: Secondary | ICD-10-CM | POA: Diagnosis present

## 2024-08-28 DIAGNOSIS — Z8619 Personal history of other infectious and parasitic diseases: Secondary | ICD-10-CM | POA: Diagnosis not present

## 2024-08-28 DIAGNOSIS — D6959 Other secondary thrombocytopenia: Secondary | ICD-10-CM | POA: Diagnosis present

## 2024-08-28 DIAGNOSIS — T378X5A Adverse effect of other specified systemic anti-infectives and antiparasitics, initial encounter: Secondary | ICD-10-CM | POA: Diagnosis present

## 2024-08-28 DIAGNOSIS — D696 Thrombocytopenia, unspecified: Secondary | ICD-10-CM | POA: Diagnosis present

## 2024-08-28 DIAGNOSIS — D693 Immune thrombocytopenic purpura: Secondary | ICD-10-CM | POA: Diagnosis present

## 2024-08-28 DIAGNOSIS — Z880 Allergy status to penicillin: Secondary | ICD-10-CM | POA: Diagnosis not present

## 2024-08-28 LAB — CBC
HCT: 38.4 % (ref 36.0–46.0)
Hemoglobin: 12.3 g/dL (ref 12.0–15.0)
MCH: 29.5 pg (ref 26.0–34.0)
MCHC: 32 g/dL (ref 30.0–36.0)
MCV: 92.1 fL (ref 80.0–100.0)
Platelets: 6 K/uL — CL (ref 150–400)
RBC: 4.17 MIL/uL (ref 3.87–5.11)
RDW: 13.7 % (ref 11.5–15.5)
WBC: 10.4 K/uL (ref 4.0–10.5)
nRBC: 0 % (ref 0.0–0.2)

## 2024-08-28 LAB — COMPREHENSIVE METABOLIC PANEL WITH GFR
ALT: 25 U/L (ref 0–44)
ALT: 37 U/L (ref 0–44)
AST: 33 U/L (ref 15–41)
AST: 44 U/L — ABNORMAL HIGH (ref 15–41)
Albumin: 4.2 g/dL (ref 3.5–5.0)
Albumin: 4.5 g/dL (ref 3.5–5.0)
Alkaline Phosphatase: 74 U/L (ref 38–126)
Alkaline Phosphatase: 86 U/L (ref 38–126)
Anion gap: 12 (ref 5–15)
Anion gap: 13 (ref 5–15)
BUN: 9 mg/dL (ref 6–20)
BUN: 8 mg/dL (ref 6–20)
CO2: 23 mmol/L (ref 22–32)
CO2: 22 mmol/L (ref 22–32)
Calcium: 9.2 mg/dL (ref 8.9–10.3)
Calcium: 9.9 mg/dL (ref 8.9–10.3)
Chloride: 102 mmol/L (ref 98–111)
Chloride: 101 mmol/L (ref 98–111)
Creatinine, Ser: 0.95 mg/dL (ref 0.44–1.00)
Creatinine, Ser: 0.89 mg/dL (ref 0.44–1.00)
GFR, Estimated: >60 mL/min (ref >60)
GFR, Estimated: >60 mL/min (ref >60)
Glucose, Bld: 82 mg/dL (ref 70–99)
Glucose, Bld: 151 mg/dL — ABNORMAL HIGH (ref 70–99)
Potassium: 4.2 mmol/L (ref 3.5–5.1)
Potassium: 4.9 mmol/L (ref 3.5–5.1)
Sodium: 136 mmol/L (ref 135–145)
Sodium: 136 mmol/L (ref 135–145)
Total Bilirubin: 0.4 mg/dL (ref 0.0–1.2)
Total Bilirubin: 0.4 mg/dL (ref 0.0–1.2)
Total Protein: 7.1 g/dL (ref 6.5–8.1)
Total Protein: 7.9 g/dL (ref 6.5–8.1)

## 2024-08-28 LAB — CBC WITH DIFFERENTIAL/PLATELET
Abs Immature Granulocytes: 0.03 K/uL (ref 0.00–0.07)
Basophils Absolute: 0.1 K/uL (ref 0.0–0.1)
Basophils Relative: 1 %
Eosinophils Absolute: 0.2 K/uL (ref 0.0–0.5)
Eosinophils Relative: 2 %
HCT: 39.2 % (ref 36.0–46.0)
Hemoglobin: 12.6 g/dL (ref 12.0–15.0)
Immature Granulocytes: 0 %
Lymphocytes Relative: 11 %
Lymphs Abs: 1 K/uL (ref 0.7–4.0)
MCH: 29.6 pg (ref 26.0–34.0)
MCHC: 32.1 g/dL (ref 30.0–36.0)
MCV: 92 fL (ref 80.0–100.0)
Monocytes Absolute: 0.2 K/uL (ref 0.1–1.0)
Monocytes Relative: 2 %
Neutro Abs: 8.1 K/uL — ABNORMAL HIGH (ref 1.7–7.7)
Neutrophils Relative %: 84 %
Platelets: <5 K/uL — CL (ref 150–400)
RBC: 4.26 MIL/uL (ref 3.87–5.11)
RDW: 13.9 % (ref 11.5–15.5)
WBC: 9.5 K/uL (ref 4.0–10.5)
nRBC: 0 % (ref 0.0–0.2)

## 2024-08-28 LAB — DIC (DISSEMINATED INTRAVASCULAR COAGULATION)PANEL
D-Dimer, Quant: 1.62 ug{FEU}/mL — ABNORMAL HIGH (ref 0.00–0.50)
D-Dimer, Quant: 1.79 ug{FEU}/mL — ABNORMAL HIGH (ref 0.00–0.50)
Fibrinogen: 334 mg/dL (ref 210–475)
Fibrinogen: 379 mg/dL (ref 210–475)
INR: 1 (ref 0.8–1.2)
INR: 1 (ref 0.8–1.2)
Platelets: <5 K/uL — CL (ref 150–400)
Platelets: <5 K/uL — CL (ref 150–400)
Prothrombin Time: 14.1 s (ref 11.4–15.2)
Prothrombin Time: 14.2 s (ref 11.4–15.2)
Smear Review: NONE SEEN
Smear Review: NONE SEEN
aPTT: 30 s (ref 24–36)
aPTT: 27 s (ref 24–36)

## 2024-08-28 LAB — TECHNOLOGIST SMEAR REVIEW: Plt Morphology: NORMAL

## 2024-08-28 LAB — LACTATE DEHYDROGENASE
LDH: 345 U/L — ABNORMAL HIGH (ref 98–192)
LDH: 354 U/L — ABNORMAL HIGH (ref 98–192)

## 2024-08-28 LAB — MAGNESIUM: Magnesium: 2.2 mg/dL (ref 1.7–2.4)

## 2024-08-28 LAB — ABO/RH: ABO/RH(D): B POS

## 2024-08-28 MED ORDER — SODIUM CHLORIDE 0.9% IV SOLUTION: Freq: Once | INTRAVENOUS | Status: AC

## 2024-08-28 MED ORDER — ACETAMINOPHEN 650 MG RE SUPP: 650.0000 mg | Freq: Four times a day (QID) | RECTAL | Status: DC | PRN

## 2024-08-28 MED ORDER — ALUM & MAG HYDROXIDE-SIMETH 200-200-20 MG/5ML PO SUSP
30.0000 mL | Freq: Once | ORAL | Status: AC
  Administered 2024-08-28: 30 mL via ORAL
  Filled 2024-08-28: qty 30

## 2024-08-28 MED ORDER — LIDOCAINE VISCOUS HCL 2 % MT SOLN
15.0000 mL | Freq: Once | OROMUCOSAL | Status: AC
  Administered 2024-08-28: 15 mL via ORAL
  Filled 2024-08-28: qty 15

## 2024-08-28 MED ORDER — DIPHENHYDRAMINE HCL 25 MG PO CAPS: 25.0000 mg | ORAL_CAPSULE | Freq: Four times a day (QID) | ORAL | Status: DC | PRN

## 2024-08-28 MED ORDER — FAMOTIDINE 20 MG PO TABS
20.0000 mg | ORAL_TABLET | Freq: Once | ORAL | Status: AC
  Administered 2024-08-28: 20 mg via ORAL
  Filled 2024-08-28: qty 1

## 2024-08-28 MED ORDER — DIPHENHYDRAMINE HCL 25 MG PO CAPS
25.0000 mg | ORAL_CAPSULE | Freq: Once | ORAL | Status: AC
  Administered 2024-08-28: 25 mg via ORAL
  Filled 2024-08-28: qty 1

## 2024-08-28 MED ORDER — PREDNISONE 50 MG PO TABS
60.0000 mg | ORAL_TABLET | Freq: Every day | ORAL | Status: DC
  Administered 2024-08-29: 60 mg via ORAL
  Filled 2024-08-28 (×3): qty 1

## 2024-08-28 MED ORDER — ONDANSETRON HCL 4 MG/2ML IJ SOLN: 4.0000 mg | Freq: Four times a day (QID) | INTRAMUSCULAR | Status: DC | PRN

## 2024-08-28 MED ORDER — ACETAMINOPHEN 325 MG PO TABS: 650.0000 mg | ORAL_TABLET | Freq: Four times a day (QID) | ORAL | Status: DC | PRN

## 2024-08-28 MED ORDER — PREDNISONE 50 MG PO TABS
60.0000 mg | ORAL_TABLET | Freq: Once | ORAL | Status: AC
  Administered 2024-08-28: 60 mg via ORAL
  Filled 2024-08-28: qty 1
  Filled 2024-08-28: qty 2

## 2024-08-28 NOTE — ED Triage Notes (Signed)
 Pt reports rash across body and itching starting 30 minutes ago at work. Pt took zyrtec  PTA with no relief. Pt reports initially feeling short of breath and having some heartburn. Sob has subsided and heartburn is almost gone as well. NAD noted in triage.

## 2024-08-28 NOTE — Plan of Care (Signed)

## 2024-08-28 NOTE — H&P (Signed)
 History and Physical      [name] FMW:[id number] DOB: 03-12-2001 DOA: 08/27/2024; DOS: 08/28/2024  PCP: Patient, No Pcp Per (will further assess) Patient coming from: home   I have personally briefly reviewed patient's old medical records in [facility]  Chief Complaint: Rash  HPI: Samantha Long is a 23 y.o. 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   Patient reports that she completed a course of Macrobid  2 days ago, followed by development of a pruritic rash over the last day, reporting erythematous/purple rash associated with pruritus and distribution involving bilateral upper extremities, upper portion of chest.  Denies any associated swelling of the lips or tongue, nor any associated difficulty swallowing.  No wheezing, stridor, shortness of breath, nor any acute abdominal discomfort, or diarrhea.  EDP also noted a petechial rash involving the pharynx.    ED Course:  Vital signs in the ED were notable for the following: Afebrile; rates in the 80s to 90s; systolic pressures in the 120s, respiratory 14-18, oxygen saturation 100% on room air.  Labs were notable for the following: CMP notable for potassium 4.2, carbonate 23, creatinine 0.95, liver enzymes were within normal limits, although the sample was noted to be associated with hemolysis.  Magnesium 2.2.  CBC notable for white cell count 7500, hemoglobin 12.3 compared to 11.0 on 07/05/2024, platelet count less than 5 relative to most recent prior platelet count of 268 on 07/05/2024.  Per my interpretation, EKG in ED demonstrated the following: No EKG performed today.  Imaging in the ED, per corresponding formal radiology read, was notable for the following: No imaging performed today.  EDP discussed patient's case with on-call heme-onc, Dr. Sherrod, who suspected  ITP. Dr. Sherrod conveys that heme-onc will formally consult/see pt this AM. He recommends Prednisone  1 mg/kg PO qdaily in the meantime, without rec for IVIG or plasmapheresis.  While in the ED, the following were administered: Benadryl  25 mg p.o. x 1 dose, prednisone  60 mg p.o. x 1 dose.  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    Review of Systems: As per HPI otherwise 10 point review of systems negative.   Past Medical History:  Diagnosis Date   Chlamydia 09/24/2018   Gonorrhea 09/24/2018   Trichimoniasis 09/24/2018    History reviewed. No pertinent surgical history.  Social History:  reports that she has never smoked. She has been exposed to tobacco smoke. She has never used smokeless tobacco. She reports current drug use. Drug: Marijuana. She reports that she does not drink alcohol.   Allergies  Allergen Reactions   Amoxicillin Hives    Has patient had a PCN reaction causing immediate rash, facial/tongue/throat swelling, SOB or lightheadedness with hypotension: yes Has patient had a PCN reaction causing severe rash involving mucus membranes or skin necrosis: no Has patient had a PCN reaction that required hospitalization: ed visit Has patient had a PCN reaction occurring within the last 10 years: no If all of the above answers are NO, then may proceed with Cephalosporin use.     Family History  Problem Relation Age of Onset   Healthy Mother    Healthy Father     Family history reviewed and not pertinent    Prior to Admission medications   Medication Sig Start Date End Date Taking? Authorizing Provider  albuterol  (VENTOLIN   HFA) 108 (90 Base) MCG/ACT inhaler Inhale 1-2 puffs into the lungs every 6 (six) hours as needed for wheezing or shortness of breath. 03/23/24   Reddick, Johnathan B, NP  azelastine  (ASTELIN ) 0.1 % nasal spray Place 1 spray into both nostrils 2 (two) times daily. Use in each nostril  as directed Patient not taking: Reported on 07/07/2024 03/23/24   Reddick, Johnathan B, NP  fluconazole  (DIFLUCAN ) 150 MG tablet Take 1 tablet (150 mg total) by mouth daily. Take 1 tablet on 6/25 and may repeat in 3 days if symptoms persist 06/01/24   Mayer, Jodi R, NP  metroNIDAZOLE  (METROGEL ) 0.75 % vaginal gel Place 1 Applicatorful vaginally at bedtime. 06/24/24   Vivienne Delon HERO, PA-C  nitrofurantoin , macrocrystal-monohydrate, (MACROBID ) 100 MG capsule Take 1 capsule (100 mg total) by mouth 2 (two) times daily. 05/31/24   Mayer, Jodi R, NP  ondansetron  (ZOFRAN ) 4 MG tablet Take 1 tablet (4 mg total) by mouth every 6 (six) hours. 08/19/24   Merilee Andrea CROME, NP  pantoprazole  (PROTONIX ) 40 MG tablet Take 1 tablet (40 mg total) by mouth daily. Patient not taking: Reported on 03/30/2024 08/29/23   Dasie Faden, MD  sucralfate  (CARAFATE ) 1 g tablet Take 1 tablet (1 g total) by mouth 4 (four) times daily. Patient not taking: Reported on 03/30/2024 08/29/23   Dasie Faden, MD     Objective    Physical Exam: Vitals:   08/27/24 2353 08/28/24 0000 08/28/24 0005  BP: 122/67 127/72   Pulse: 81 99   Resp: 18 14   Temp: 98.1 F (36.7 C)    TempSrc: Oral    SpO2: 100% 100%   Weight:   65.8 kg  Height:   5' 2 (1.575 m)    General: appears to be stated age; alert, oriented Skin: warm, dry, petechia, purpura involving bilateral upper extremities, upper portion of chest Head:  AT/[place] Mouth:  Oral mucosa membranes appear moist, normal dentition Neck: supple; trachea midline Heart:  RRR; did not appreciate any M/R/G Lungs: CTAB, did not appreciate any wheezes, rales, or rhonchi Abdomen: + BS; soft, ND, NT Vascular: 2+ pedal pulses b/l; 2+ radial pulses b/l Extremities: no peripheral edema, no muscle wasting   Labs on Admission: I have personally reviewed following labs and imaging studies  CBC: Recent Labs  Lab 08/28/24 0128  WBC 7.5  NEUTROABS 5.3  HGB 12.3  HCT 39.1  MCV 92.9   PLT <5*   Basic Metabolic Panel: No results for input(s): NA, K, CL, CO2, GLUCOSE, BUN, CREATININE, CALCIUM, MG, PHOS in the last 168 hours. GFR: CrCl cannot be calculated (Patient's most recent lab result is older than the maximum 21 days allowed.). Liver Function Tests: No results for input(s): AST, ALT, ALKPHOS, BILITOT, PROT, ALBUMIN in the last 168 hours. No results for input(s): LIPASE, AMYLASE in the last 168 hours. No results for input(s): AMMONIA in the last 168 hours. Coagulation Profile: No results for input(s): INR, PROTIME in the last 168 hours. Cardiac Enzymes: No results for input(s): CKTOTAL, CKMB, CKMBINDEX, TROPONINI in the last 168 hours. BNP (last 3 results) No results for input(s): PROBNP in the last 8760 hours. HbA1C: No results for input(s): HGBA1C in the last 72 hours. CBG: No results for input(s): GLUCAP in the last 168 hours. Lipid Profile: No results for input(s): CHOL, HDL, LDLCALC, TRIG, CHOLHDL, LDLDIRECT in the last 72 hours. Thyroid Function Tests: No results for input(s): TSH, T4TOTAL, FREET4, T3FREE, THYROIDAB in the last 72 hours. Anemia Panel:  No results for input(s): VITAMINB12, FOLATE, FERRITIN, TIBC, IRON, RETICCTPCT in the last 72 hours. Urine analysis:    Component Value Date/Time   COLORURINE YELLOW 10/26/2023 1446   APPEARANCEUR HAZY (A) 10/26/2023 1446   LABSPEC 1.024 10/26/2023 1446   PHURINE 6.0 10/26/2023 1446   GLUCOSEU NEGATIVE 10/26/2023 1446   HGBUR SMALL (A) 10/26/2023 1446   BILIRUBINUR negative 08/19/2024 1420   KETONESUR negative 08/19/2024 1420   KETONESUR NEGATIVE 10/26/2023 1446   PROTEINUR =30 (A) 08/19/2024 1420   PROTEINUR 30 (A) 10/26/2023 1446   UROBILINOGEN 0.2 08/19/2024 1420   UROBILINOGEN 0.2 02/04/2022 1218   NITRITE Negative 08/19/2024 1420   NITRITE NEGATIVE 10/26/2023 1446   LEUKOCYTESUR Trace (A) 08/19/2024 1420    LEUKOCYTESUR LARGE (A) 10/26/2023 1446    Radiological Exams on Admission: No results found.    Assessment/Plan    Principal Problem:   Drug-induced ITP Active Problems:   Thrombocytopenia (HCC)   Purpura (HCC)     #) Drug-induced ITP: Suspected diagnosis in setting of development of petechiae, purpura following recent course of Macrobid , with CMP associated with hemolysis, as above, will along with new onset thrombocytopenia, which is new relative to the CBC it was performed at the end of July 2025.  Hemoglobin appears stable, as above.  And DIC panel is currently pending.  No evidence of anaphylactic response.  Appears hemodynamically stable.  Renal function appears at baseline.  EDP discussed patient's case with on-call heme-onc, Dr. Sherrod, who suspected ITP. Dr. Sherrod conveys that heme-onc will formally consult/see pt this AM. He recommends Prednisone  1 mg/kg PO qdaily in the meantime, without rec for IVIG or plasmapheresis.  Plan: Heme-onc to formally consult see the patient this morning, as above.  Prednisone  60 mg p.o. daily, per recommendation of heme-onc.  Ordered type and screen, will follow-up result of DIC panel.  Ordered every 4 hour DIC panels through noon today.  Add on LDH, followed by every 4 hours LDH monitoring through noon today.  CBC in the morning.  Prn Benadryl  for pruritus.  Refraining from pharmacologic DVT prophylaxis.     DVT prophylaxis: SCD's   Code Status: Full code Family Communication: none Disposition Plan: Per Rounding Team Consults called: EDP discussed patient's case with on-call heme-onc, Dr. Sherrod, who suspected ITP. Dr. Sherrod conveys that heme-onc will formally consult/see pt this AM. He recommends Prednisone  1 mg/kg PO qdaily in the meantime, without rec for IVIG or plasmapheresis.;  Admission status: inpatient    I SPENT GREATER THAN 75  MINUTES IN CLINICAL CARE TIME/MEDICAL DECISION-MAKING IN COMPLETING THIS ADMISSION.      Halah Whiteside B Breeanne Oblinger DO [facility] From 7PM - 7AM   08/28/2024, 3:20 AM

## 2024-08-28 NOTE — ED Provider Notes (Signed)
 Samantha Long EMERGENCY DEPARTMENT AT [facility] Provider Note   CSN: [id number] Arrival date & time: 08/27/24  2347    Patient presents with: Rash   Samantha Long is a 23 y.o. female.    23 year old female presents to the emergency department for evaluation of a rash.  She began to experience puffiness in her face as well as itching on her body.  Further reports an burning sensation in her chest with nausea.  This started around 2300 tonight.  She took a Zyrtec  prior to arrival with some minimal improvement, mostly to her heartburn-like symptoms.  Continues to experience itching.  No inability to swallow, SOB, fever.  Completed a course of Macrobid  2 days ago for UTI tx.  Denies use of NSAIDs.  The history is provided by the patient. No language interpreter was used.  Rash      Prior to Admission medications   Medication Sig Start Date End Date Taking? Authorizing Provider  albuterol  (VENTOLIN  HFA) 108 (90 Base) MCG/ACT inhaler Inhale 1-2 puffs into the lungs every 6 (six) hours as needed for wheezing or shortness of breath. 03/23/24   Reddick, Johnathan B, NP  azelastine  (ASTELIN ) 0.1 % nasal spray Place 1 spray into both nostrils 2 (two) times daily. Use in each nostril as directed Patient not taking: Reported on 07/07/2024 03/23/24   Reddick, Johnathan B, NP  fluconazole  (DIFLUCAN ) 150 MG tablet Take 1 tablet (150 mg total) by mouth daily. Take 1 tablet on 6/25 and may repeat in 3 days if symptoms persist 06/01/24   Mayer, Jodi R, NP  metroNIDAZOLE  (METROGEL ) 0.75 % vaginal gel Place 1 Applicatorful vaginally at bedtime. 06/24/24   Vivienne Delon HERO, PA-C  nitrofurantoin , macrocrystal-monohydrate, (MACROBID ) 100 MG capsule Take 1 capsule (100 mg total) by mouth 2 (two) times daily. 05/31/24   Mayer, Jodi R, NP  ondansetron  (ZOFRAN ) 4 MG tablet Take 1 tablet (4 mg total) by mouth every 6 (six) hours. 08/19/24   Merilee Andrea CROME, NP  pantoprazole  (PROTONIX ) 40 MG tablet  Take 1 tablet (40 mg total) by mouth daily. Patient not taking: Reported on 03/30/2024 08/29/23   Dasie Faden, MD  sucralfate  (CARAFATE ) 1 g tablet Take 1 tablet (1 g total) by mouth 4 (four) times daily. Patient not taking: Reported on 03/30/2024 08/29/23   Dasie Faden, MD    Allergies: Amoxicillin    Review of Systems  Skin:  Positive for rash.  Ten systems reviewed and are negative for acute change, except as noted in the HPI.    Updated Vital Signs BP 127/72   Pulse 99   Temp 98.1 F (36.7 C) (Oral)   Resp 14   Ht 5' 2 (1.575 m)   Wt 65.8 kg   LMP 08/11/2024 (Exact Date)   SpO2 100%   BMI 26.52 kg/m   Physical Exam Vitals and nursing note reviewed.  Constitutional:      General: She is not in acute distress.    Appearance: She is well-developed. She is not diaphoretic.     Comments: Nontoxic appearing and in NAD  HENT:     Head: Normocephalic and atraumatic.     Mouth/Throat:     Comments: Petechiae to the soft palate. Uvula midline. No angioedema. Normal phonation and tolerating secretions without difficulty. Eyes:     General: No scleral icterus.    Conjunctiva/sclera: Conjunctivae normal.  Cardiovascular:     Rate and Rhythm: Normal rate and regular rhythm.     Pulses: Normal  pulses.  Pulmonary:     Effort: Pulmonary effort is normal. No respiratory distress.     Comments: Respirations even and unlabored Musculoskeletal:        General: Normal range of motion.     Cervical back: Normal range of motion.  Skin:    General: Skin is warm and dry.     Coloration: Skin is not pale.     Findings: Rash present. No erythema.     Comments: Scattered areas of petechiae with dermatographia to trunk and extremities.  Neurological:     Mental Status: She is alert and oriented to person, place, and time.  Psychiatric:        Behavior: Behavior normal.        (all labs ordered are listed, but only abnormal results are displayed) Labs Reviewed  CBC WITH  DIFFERENTIAL/PLATELET - Abnormal; Notable for the following components:      Result Value   Platelets <5 (*)    All other components within normal limits  COMPREHENSIVE METABOLIC PANEL WITH GFR  DIC (DISSEMINATED INTRAVASCULAR COAGULATION)PANEL    EKG: None  Radiology: No results found.   .Critical Care  Performed by: Keith Sor, PA-C Authorized by: Keith Sor, PA-C   Critical care provider statement:    Critical care time (minutes):  30   Critical care time was exclusive of:  Separately billable procedures and treating other patients   Critical care was necessary to treat or prevent imminent or life-threatening deterioration of the following conditions: ITP.   Critical care was time spent personally by me on the following activities:  Development of treatment plan with patient or surrogate, discussions with consultants, evaluation of patient's response to treatment, examination of patient, ordering and review of laboratory studies, ordering and review of radiographic studies, ordering and performing treatments and interventions, pulse oximetry, re-evaluation of patient's condition, review of old charts and obtaining history from patient or surrogate   I assumed direction of critical care for this patient from another provider in my specialty: no     Care discussed with: admitting provider      Medications Ordered in the ED  alum & mag hydroxide-simeth (MAALOX/MYLANTA) 200-200-20 MG/5ML suspension 30 mL (30 mLs Oral Given 08/28/24 0120)    And  lidocaine  (XYLOCAINE ) 2 % viscous mouth solution 15 mL (15 mLs Oral Given 08/28/24 0120)  diphenhydrAMINE  (BENADRYL ) capsule 25 mg (25 mg Oral Given 08/28/24 0120)  famotidine  (PEPCID ) tablet 20 mg (20 mg Oral Given 08/28/24 0120)  predniSONE  (DELTASONE ) tablet 60 mg (60 mg Oral Given 08/28/24 0253)    Clinical Course as of 08/28/24 0305  Sun Aug 28, 2024  0237 Platelets <5. DIC panel ordered. Page placed to heme/onc to discuss rec's for  suspected ITP.  [KH]  0239 Dr. Sherrod recommends Prednisone  1mg /kg daily. Heme/Onc will plan to consult on patient in AM. [KH]  0305 Spoke with MD Howerter of [facility] who will assess the patient for admission. [KH]    Clinical Course User Index [KH] Keith Sor, PA-C                                 Medical Decision Making Amount and/or Complexity of Data Reviewed Labs: ordered.  Risk OTC drugs. Prescription drug management. Decision regarding hospitalization.   This patient presents to the ED for concern of rash, this involves an extensive number of treatment options, and is a complaint that carries with it  a high risk of complications and morbidity.  The differential diagnosis includes urticaria vs dermatitis vs EMM vs SJS vs ITP vs TTP   Co morbidities that complicate the patient evaluation  None known   Lab Tests:  I Ordered, and personally interpreted labs.  The pertinent results include:  Platelets <5   Cardiac Monitoring:  The patient was maintained on a cardiac monitor.  I personally viewed and interpreted the cardiac monitored which showed an underlying rhythm of: NSR   Medicines ordered and prescription drug management:  I ordered medication including Prednisone  for ITP  Reevaluation of the patient after these medicines showed that the patient stayed the same I have reviewed the patients home medicines and have made adjustments as needed   Test Considered:  CT head - no neuro deficits; felt low yield   Critical Interventions:  Initiation of steroids.   Consultations Obtained:  I requested consultation with Dr. Sherrod of hematology,  and discussed lab and imaging findings as well as pertinent plan - they recommend: Prednisone  at 1mg /kg daily.   Problem List / ED Course:  As above   Reevaluation:  After the interventions noted above, I reevaluated the patient and found that they have :stayed the same   Dispostion:  After consideration of  the diagnostic results and the patients response to treatment, I feel that the patent would benefit from admission for further evaluation and management of ITP. Suspected to be drug induced. Heme/Onc to see in consultation. Dr. Marcene to admit.       Final diagnoses:  Idiopathic thrombocytopenic purpura [facility])    ED Discharge Orders     None          Keith Sor, PA-C 08/28/24 0357    Palumbo, April, MD 08/28/24 [id number]

## 2024-08-28 NOTE — Consult Note (Signed)
  [facility] [phone]   [phone]  CONSULT NOTE  REFERRING PHYSICIAN: Dr. Owen Lore  REASON FOR CONSULTATION:  23 years old African-American female with thrombocytopenia.  HPI Samantha Long is a 23 y.o. female.    Discussed the use of AI scribe software for clinical note transcription with the patient, who gave verbal consent to proceed. The patient is a 23 year old who presents with rash and low platelets.  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 She then noticed markings on her hand, accompanied by pruritus and a burning sensation on her skin. Rubbing her skin led to the appearance of marks all over her body, prompting her to seek medical attention.  She has been experiencing chills and currently feels febrile. There is no history of bleeding, such as epistaxis, gingival bleeding, or hematuria/melena. Recently, she completed a course of nitrofurantoin  for a urinary tract infection, during which she developed lip swelling and urticaria. Her mother administered Zyrtec , suspecting an allergic reaction. She also took ibuprofen  for a headache.  Her past medical history includes an allergy to amoxicillin. She denies any other medical conditions such as hypertension, diabetes, cardiovascular disease, or cerebrovascular accidents. There is no family history of hematological disorders or malignancies. She is single, childless, and works as a Conservation officer, nature. She uses marijuana twice daily but does not smoke cigarettes or consume alcohol.  In terms of current medications, she has been taking Zyrtec  and occasionally uses Benadryl  for her frequent breakouts. In the emergency department she had CBCs that showed platelets count less than 5000.  She did not have any other abnormality with normal hemoglobin, hematocrit, leukocyte count as well as renal function.      Past  Medical History:  Diagnosis Date   Chlamydia 09/24/2018   Gonorrhea 09/24/2018   Trichimoniasis 09/24/2018      History reviewed. No pertinent surgical history.  Family History  Problem Relation Age of Onset   Healthy Mother    Healthy Father     Social History Social History   Tobacco Use   Smoking status: Never    Passive exposure: Yes   Smokeless tobacco: Never  Vaping Use   Vaping status: Never Used  Substance Use Topics   Alcohol use: No   Drug use: Yes    Types: Marijuana    Comment: daily    Allergies  Allergen Reactions   Amoxicillin Hives    Has patient had a PCN reaction causing immediate rash, facial/tongue/throat swelling, SOB or lightheadedness with hypotension: yes Has patient had a PCN reaction causing severe rash involving mucus membranes or skin necrosis: no Has patient had a PCN reaction that required hospitalization: ed visit Has patient had a PCN reaction occurring within the last 10 years: no If all of the above answers are NO, then may proceed with Cephalosporin use.     Current Facility-Administered Medications  Medication Dose Route Frequency Provider Last Rate Last Admin   acetaminophen  (TYLENOL ) tablet 650 mg  650 mg Oral Q6H PRN Howerter, Justin B, DO       Or   acetaminophen  (TYLENOL ) suppository 650 mg  650 mg Rectal Q6H PRN Howerter, Justin B, DO       diphenhydrAMINE  (BENADRYL ) capsule 25 mg  25 mg Oral Q6H PRN Howerter, Justin B, DO       ondansetron  (ZOFRAN ) injection 4 mg  4 mg Intravenous Q6H PRN Howerter,  Eva NOVAK, DO       [START ON 08/29/2024] predniSONE  (DELTASONE ) tablet 60 mg  60 mg Oral Q breakfast Howerter, Justin B, DO       Current Outpatient Medications  Medication Sig Dispense Refill   albuterol  (VENTOLIN  HFA) 108 (90 Base) MCG/ACT inhaler Inhale 1-2 puffs into the lungs every 6 (six) hours as needed for wheezing or shortness of breath. (Patient not taking: Reported on 08/28/2024) 6.7 g 0   azelastine  (ASTELIN ) 0.1  % nasal spray Place 1 spray into both nostrils 2 (two) times daily. Use in each nostril as directed (Patient not taking: Reported on 07/07/2024) 30 mL 1   fluconazole  (DIFLUCAN ) 150 MG tablet Take 1 tablet (150 mg total) by mouth daily. Take 1 tablet on 6/25 and may repeat in 3 days if symptoms persist (Patient not taking: Reported on 08/28/2024) 2 tablet 0   metroNIDAZOLE  (METROGEL ) 0.75 % vaginal gel Place 1 Applicatorful vaginally at bedtime. (Patient not taking: Reported on 08/28/2024) 70 g 0   nitrofurantoin , macrocrystal-monohydrate, (MACROBID ) 100 MG capsule Take 1 capsule (100 mg total) by mouth 2 (two) times daily. (Patient not taking: Reported on 08/28/2024) 14 capsule 0   ondansetron  (ZOFRAN ) 4 MG tablet Take 1 tablet (4 mg total) by mouth every 6 (six) hours. (Patient not taking: Reported on 08/28/2024) 12 tablet 0   pantoprazole  (PROTONIX ) 40 MG tablet Take 1 tablet (40 mg total) by mouth daily. (Patient not taking: Reported on 03/30/2024) 30 tablet 0   sucralfate  (CARAFATE ) 1 g tablet Take 1 tablet (1 g total) by mouth 4 (four) times daily. (Patient not taking: Reported on 03/30/2024) 30 tablet 0    Review of Systems  Constitutional: negative Eyes: negative Ears, nose, mouth, throat, and face: negative Respiratory: negative Cardiovascular: negative Gastrointestinal: negative Genitourinary:negative Integument/breast: negative Hematologic/lymphatic: positive for petechiae Musculoskeletal:negative Neurological: negative Behavioral/Psych: negative Endocrine: negative Allergic/Immunologic: negative  Physical Exam  MJO:jozmu, healthy, no distress, well nourished, and well developed SKIN: skin color, texture, turgor are normal, no rashes or significant lesions HEAD: Normocephalic, No masses, lesions, tenderness or abnormalities EYES: normal, PERRLA, Conjunctiva are pink and non-injected EARS: External ears normal, Canals clear OROPHARYNX:no exudate, no erythema, and lips, buccal  mucosa, and tongue normal  NECK: supple, no adenopathy, no JVD LYMPH:  no palpable lymphadenopathy, no hepatosplenomegaly BREAST:not examined LUNGS: clear to auscultation , and palpation HEART: regular rate & rhythm, no murmurs, and no gallops ABDOMEN:abdomen soft, non-tender, normal bowel sounds, and no masses or organomegaly BACK: Back symmetric, no curvature., No CVA tenderness EXTREMITIES:no joint deformities, effusion, or inflammation, no edema  NEURO: alert & oriented x 3 with fluent speech, no focal motor/sensory deficits  PERFORMANCE STATUS: ECOG 0  LABORATORY DATA: Lab Results  Component Value Date   WBC 9.5 08/28/2024   HGB 12.6 08/28/2024   HCT 39.2 08/28/2024   MCV 92.0 08/28/2024   PLT <5 (LL) 08/28/2024   PLT <5 (LL) 08/28/2024    @LASTCHEM @  RADIOGRAPHIC STUDIES: No results found.  ASSESSMENT AND PLAN: Assessment and Plan Drug-induced thrombocytopenia Acute drug-induced thrombocytopenia likely secondary to nitrofurantoin  use for urinary tract infection. Symptoms include rash, itching, and low platelet count. Differential diagnosis includes immune-mediated thrombocytopenia (ITP) and thrombotic thrombocytopenic purpura (TTP), but peripheral blood smear shows no schistocytes and no signs of TTP such as confusion or renal impairment. Prednisone  initiated at 1 mg/kg to manage thrombocytopenia. Anticipated recovery in a few days as the medication effects subside. - Continue prednisone  1 mg/kg until platelet count improves, then  taper. - Administer a unit of platelets to increase platelet count. - Monitor platelet count and symptoms closely. - Consider follow-up in office in 1-2 weeks if needed. The patient voices understanding of current disease status and treatment options and is in agreement with the current care plan.  All questions were answered. The patient knows to call the clinic with any problems, questions or concerns. We can certainly see the patient much  sooner if necessary.  Thank you so much for allowing me to participate in the care of Samantha Long. I will continue to follow up the patient with you and assist in her care.  Disclaimer: This note was dictated with voice recognition software. Similar sounding words can inadvertently be transcribed and may not be corrected upon review.   Sherrod MARLA Sherrod August 28, 2024, 9:17 AM

## 2024-08-28 NOTE — Progress Notes (Signed)
 PROGRESS NOTE    Samantha Long  FMW:[id number] DOB: 2001/09/13 DOA: 08/27/2024 PCP: Patient, No Pcp Per   Brief Narrative: 23 year old with no past medical history who presents to the ED complaining of rash, she completed a course of Macrobid  2 days prior to admission, she subsequently developed a pruritic rash over the last day erythematous purple rash associated with pruritus involving bilateral upper extremity and upper portion of her chest.  Evaluation in the ED show hemoglobin of 12 platelet counts less than 5 most recent platelet count 268 on 7/29.  Case was discussed with on-call hematology oncology Dr. Gatha who suspected ITP.  She was started on prednisone  1 mg/kg daily  Assessment & Plan:   Principal Problem:   Drug-induced ITP Active Problems:   Thrombocytopenia (HCC)   Purpura (HCC)   1-severe thrombocytopenia: Thought to be related to drug-induced thrombocytopenia, -Also in the differential ITP, unlikely TTP peripheral smear negative for schistocytes. - Patient presented with a platelet counts less than 5, petechial rash following recent course of Macrobid . - Platelet counts less than 5 - Started on prednisone  Plan to proceed with 1 unit of platelets   Estimated body mass index is 26.52 kg/m as calculated from the following:   Height as of this encounter: 5' 2 (1.575 m).   Weight as of this encounter: 65.8 kg.   DVT prophylaxis: SCDs Code Status: Full code Family Communication: Mother who was at bedside Disposition Plan:  Status is: Inpatient Remains inpatient appropriate because: Management of thrombocytopenia    Consultants:  Oncology hematology   Antimicrobials:    Subjective: She is alert and conversant, denies melena hematochezia hematemesis nosebleed.  She reports rash and bruises  Objective: Vitals:   08/27/24 2353 08/28/24 0000 08/28/24 0005 08/28/24 0740  BP: 122/67 127/72  122/74  Pulse: 81 99  68  Resp: 18 14  15   Temp: 98.1 F  (36.7 C)   98 F (36.7 C)  TempSrc: Oral   Oral  SpO2: 100% 100%  100%  Weight:   65.8 kg   Height:   5' 2 (1.575 m)    No intake or output data in the 24 hours ending 08/28/24 0821 Filed Weights   08/28/24 0005  Weight: 65.8 kg    Examination:  General exam: Appears calm and comfortable  Respiratory system: Clear to auscultation. Respiratory effort normal. Cardiovascular system: S1 & S2 heard, RRR. No JVD, murmurs, rubs, gallops or clicks. No pedal edema. Gastrointestinal system: Abdomen is nondistended, soft and nontender. No organomegaly or masses felt. Normal bowel sounds heard. Central nervous system: Alert and oriented. No focal neurological deficits. Extremities: Symmetric 5 x 5 power.   Data Reviewed: I have personally reviewed following labs and imaging studies  CBC: Recent Labs  Lab 08/28/24 0128 08/28/24 0246 08/28/24 0750  WBC 7.5  --   --   NEUTROABS 5.3  --   --   HGB 12.3  --   --   HCT 39.1  --   --   MCV 92.9  --   --   PLT <5* <5* PENDING   Basic Metabolic Panel: Recent Labs  Lab 08/28/24 0230  NA 136  K 4.2  CL 102  CO2 23  GLUCOSE 82  BUN 9  CREATININE 0.95  CALCIUM 9.2  MG 2.2   GFR: Estimated Creatinine Clearance: 82.7 mL/min (by C-G formula based on SCr of 0.95 mg/dL). Liver Function Tests: Recent Labs  Lab 08/28/24 0230  AST 33  ALT 25  ALKPHOS 74  BILITOT 0.4  PROT 7.1  ALBUMIN 4.2   No results for input(s): LIPASE, AMYLASE in the last 168 hours. No results for input(s): AMMONIA in the last 168 hours. Coagulation Profile: Recent Labs  Lab 08/28/24 0246 08/28/24 0750  INR 1.0 1.0   Cardiac Enzymes: No results for input(s): CKTOTAL, CKMB, CKMBINDEX, TROPONINI in the last 168 hours. BNP (last 3 results) No results for input(s): PROBNP in the last 8760 hours. HbA1C: No results for input(s): HGBA1C in the last 72 hours. CBG: No results for input(s): GLUCAP in the last 168 hours. Lipid  Profile: No results for input(s): CHOL, HDL, LDLCALC, TRIG, CHOLHDL, LDLDIRECT in the last 72 hours. Thyroid Function Tests: No results for input(s): TSH, T4TOTAL, FREET4, T3FREE, THYROIDAB in the last 72 hours. Anemia Panel: No results for input(s): VITAMINB12, FOLATE, FERRITIN, TIBC, IRON, RETICCTPCT in the last 72 hours. Sepsis Labs: No results for input(s): PROCALCITON, LATICACIDVEN in the last 168 hours.  No results found for this or any previous visit (from the past 240 hours).       Radiology Studies: No results found.      Scheduled Meds:  [START ON 08/29/2024] predniSONE   60 mg Oral Q breakfast   Continuous Infusions:   LOS: 0 days    Time spent: 35 minutes    Gayanne Prescott A Cordarrell Sane, MD [facility]   If 7PM-7AM, please contact night-coverage [email or web address]  08/28/2024, 8:21 AM

## 2024-08-29 DIAGNOSIS — T50905A Adverse effect of unspecified drugs, medicaments and biological substances, initial encounter: Secondary | ICD-10-CM | POA: Diagnosis not present

## 2024-08-29 DIAGNOSIS — D6959 Other secondary thrombocytopenia: Secondary | ICD-10-CM | POA: Diagnosis not present

## 2024-08-29 LAB — CBC WITH DIFFERENTIAL/PLATELET
Abs Immature Granulocytes: 0.02 K/uL (ref 0.00–0.07)
Abs Immature Granulocytes: 0.05 K/uL (ref 0.00–0.07)
Basophils Absolute: 0.1 K/uL (ref 0.0–0.1)
Basophils Absolute: 0.1 K/uL (ref 0.0–0.1)
Basophils Relative: 1 %
Basophils Relative: 0 %
Eosinophils Absolute: 0.4 K/uL (ref 0.0–0.5)
Eosinophils Absolute: 0.3 K/uL (ref 0.0–0.5)
Eosinophils Relative: 6 %
Eosinophils Relative: 2 %
HCT: 39.1 % (ref 36.0–46.0)
HCT: 38.1 % (ref 36.0–46.0)
Hemoglobin: 12.3 g/dL (ref 12.0–15.0)
Hemoglobin: 12.2 g/dL (ref 12.0–15.0)
Immature Granulocytes: 0 %
Immature Granulocytes: 0 %
Lymphocytes Relative: 21 %
Lymphocytes Relative: 26 %
Lymphs Abs: 1.6 K/uL (ref 0.7–4.0)
Lymphs Abs: 3 K/uL (ref 0.7–4.0)
MCH: 29.2 pg (ref 26.0–34.0)
MCH: 29.7 pg (ref 26.0–34.0)
MCHC: 31.5 g/dL (ref 30.0–36.0)
MCHC: 32 g/dL (ref 30.0–36.0)
MCV: 92.9 fL (ref 80.0–100.0)
MCV: 92.7 fL (ref 80.0–100.0)
Monocytes Absolute: 0.2 K/uL (ref 0.1–1.0)
Monocytes Absolute: 0.6 K/uL (ref 0.1–1.0)
Monocytes Relative: 3 %
Monocytes Relative: 5 %
Neutro Abs: 5.3 K/uL (ref 1.7–7.7)
Neutro Abs: 7.5 K/uL (ref 1.7–7.7)
Neutrophils Relative %: 69 %
Neutrophils Relative %: 67 %
Platelets: <5 K/uL — CL (ref 150–400)
Platelets: 6 K/uL — CL (ref 150–400)
RBC: 4.21 MIL/uL (ref 3.87–5.11)
RBC: 4.11 MIL/uL (ref 3.87–5.11)
RDW: 13.9 % (ref 11.5–15.5)
RDW: 13.6 % (ref 11.5–15.5)
WBC: 7.5 K/uL (ref 4.0–10.5)
WBC: 11.4 K/uL — ABNORMAL HIGH (ref 4.0–10.5)
nRBC: 0 % (ref 0.0–0.2)
nRBC: 0 % (ref 0.0–0.2)

## 2024-08-29 LAB — BPAM PLATELET PHERESIS
Blood Product Expiration Date: 202509232359
ISSUE DATE / TIME: 202509211104
Unit Type and Rh: 6200

## 2024-08-29 LAB — BASIC METABOLIC PANEL WITH GFR
Anion gap: 14 (ref 5–15)
BUN: 9 mg/dL (ref 6–20)
CO2: 23 mmol/L (ref 22–32)
Calcium: 9 mg/dL (ref 8.9–10.3)
Chloride: 104 mmol/L (ref 98–111)
Creatinine, Ser: 0.65 mg/dL (ref 0.44–1.00)
GFR, Estimated: >60 mL/min (ref >60)
Glucose, Bld: 92 mg/dL (ref 70–99)
Potassium: 3.8 mmol/L (ref 3.5–5.1)
Sodium: 142 mmol/L (ref 135–145)

## 2024-08-29 LAB — PREPARE PLATELET PHERESIS: Unit division: 0

## 2024-08-29 LAB — TYPE AND SCREEN
ABO/RH(D): B POS
Antibody Screen: POSITIVE
DAT, IgG: NEGATIVE

## 2024-08-29 LAB — HEMOGLOBIN AND HEMATOCRIT, BLOOD
HCT: 42.4 % (ref 36.0–46.0)
Hemoglobin: 13.6 g/dL (ref 12.0–15.0)

## 2024-08-29 MED ORDER — LORAZEPAM 0.5 MG PO TABS
0.5000 mg | ORAL_TABLET | Freq: Three times a day (TID) | ORAL | Status: DC | PRN
Start: 2024-08-29 — End: 2024-08-30
  Administered 2024-08-29: 0.5 mg via ORAL
  Administered 2024-08-29: 1 mg via ORAL
  Filled 2024-08-29: qty 1
  Filled 2024-08-29: qty 2

## 2024-08-29 NOTE — Plan of Care (Signed)

## 2024-08-29 NOTE — Progress Notes (Signed)
   08/29/24 0905  TOC Brief Assessment  Insurance and Status Reviewed  Patient has primary care physician No (Patient goes to urgent care)  Home environment has been reviewed Resides in an apartment with family  Prior level of function: Independent with ADLs at baseline  Prior/Current Home Services No current home services  Social Drivers of Health Review SDOH reviewed no interventions necessary  Readmission risk has been reviewed Yes  Transition of care needs no transition of care needs at this time

## 2024-08-29 NOTE — Progress Notes (Signed)
 PROGRESS NOTE    Samantha Long  FMW:[id number] DOB: June 09, 2001 DOA: 08/27/2024 PCP: Patient, No Pcp Per   Brief Narrative: 23 year old with no past medical history who presents to the ED complaining of rash, she completed a course of Macrobid  2 days prior to admission, she subsequently developed a pruritic rash over the last day erythematous purple rash associated with pruritus involving bilateral upper extremity and upper portion of her chest.  Evaluation in the ED show hemoglobin of 12 platelet counts less than 5 most recent platelet count 268 on 7/29.  Case was discussed with on-call hematology oncology Dr. Gatha who suspected ITP.  She was started on prednisone  1 mg/kg daily  Assessment & Plan:   Principal Problem:   Drug-induced ITP Active Problems:   Thrombocytopenia (HCC)   Purpura (HCC)   1-severe thrombocytopenia: Thought to be related to drug-induced thrombocytopenia, -Also in the differential ITP, unlikely TTP peripheral smear negative for schistocytes. - Patient presented with a platelet counts less than 5, petechial rash following recent course of Macrobid . - Platelet counts less than 5 - Started on prednisone , continue Received 1 unit of platelets Platelet count at 6 Report having vaginal bleed. Plan to monitor, repeat cbc this afternoon.   Estimated body mass index is 24.89 kg/m as calculated from the following:   Height as of this encounter: 5' 2 (1.575 m).   Weight as of this encounter: 61.7 kg.   DVT prophylaxis: SCDs Code Status: Full code Family Communication: Mother who was at bedside Disposition Plan:  Status is: Inpatient Remains inpatient appropriate because: Management of thrombocytopenia    Consultants:  Oncology hematology   Antimicrobials:    Subjective: She wants to go home She report vaginal bleed, a lot. Nurse will monitor.    Objective: Vitals:   08/28/24 2038 08/29/24 0013 08/29/24 0419 08/29/24 0606  BP: 116/62 107/62  (!) 98/51 115/69  Pulse: (!) 55 71 65   Resp:  18 18   Temp: 97.7 F (36.5 C) 98.4 F (36.9 C) 98.5 F (36.9 C)   TempSrc: Oral Oral Oral   SpO2: 100% 100% 100%   Weight:   61.7 kg   Height:        Intake/Output Summary (Last 24 hours) at 08/29/2024 0729 Last data filed at 08/28/2024 1710 Gross per 24 hour  Intake 575.67 ml  Output --  Net 575.67 ml   Filed Weights   08/28/24 0005 08/29/24 0419  Weight: 65.8 kg 61.7 kg    Examination:  General exam: NAD Respiratory system: CTA Cardiovascular system: S 1, S 2 RRR Gastrointestinal system: BS present, soft, nt Central nervous system: alert Extremities: Symmetric 5 x 5 power.   Data Reviewed: I have personally reviewed following labs and imaging studies  CBC: Recent Labs  Lab 08/28/24 0128 08/28/24 0246 08/28/24 0750 08/28/24 1610 08/29/24 0350  WBC 7.5  --  9.5 10.4 11.4*  NEUTROABS 5.3  --  8.1*  --  7.5  HGB 12.3  --  12.6 12.3 12.2  HCT 39.1  --  39.2 38.4 38.1  MCV 92.9  --  92.0 92.1 92.7  PLT <5* <5* <5*  <5* 6* 6*   Basic Metabolic Panel: Recent Labs  Lab 08/28/24 0230 08/28/24 1052 08/29/24 0350  NA 136 136 142  K 4.2 4.9 3.8  CL 102 101 104  CO2 23 22 23   GLUCOSE 82 151* 92  BUN 9 8 9   CREATININE 0.95 0.89 0.65  CALCIUM 9.2 9.9 9.0  MG  2.2  --   --    GFR: Estimated Creatinine Clearance: 95.3 mL/min (by C-G formula based on SCr of 0.65 mg/dL). Liver Function Tests: Recent Labs  Lab 08/28/24 0230 08/28/24 1052  AST 33 44*  ALT 25 37  ALKPHOS 74 86  BILITOT 0.4 0.4  PROT 7.1 7.9  ALBUMIN 4.2 4.5   No results for input(s): LIPASE, AMYLASE in the last 168 hours. No results for input(s): AMMONIA in the last 168 hours. Coagulation Profile: Recent Labs  Lab 08/28/24 0246 08/28/24 0750  INR 1.0 1.0   Cardiac Enzymes: No results for input(s): CKTOTAL, CKMB, CKMBINDEX, TROPONINI in the last 168 hours. BNP (last 3 results) No results for input(s): PROBNP in the  last 8760 hours. HbA1C: No results for input(s): HGBA1C in the last 72 hours. CBG: No results for input(s): GLUCAP in the last 168 hours. Lipid Profile: No results for input(s): CHOL, HDL, LDLCALC, TRIG, CHOLHDL, LDLDIRECT in the last 72 hours. Thyroid Function Tests: No results for input(s): TSH, T4TOTAL, FREET4, T3FREE, THYROIDAB in the last 72 hours. Anemia Panel: No results for input(s): VITAMINB12, FOLATE, FERRITIN, TIBC, IRON, RETICCTPCT in the last 72 hours. Sepsis Labs: No results for input(s): PROCALCITON, LATICACIDVEN in the last 168 hours.  No results found for this or any previous visit (from the past 240 hours).       Radiology Studies: No results found.      Scheduled Meds:  predniSONE   60 mg Oral Q breakfast   Continuous Infusions:   LOS: 1 day    Time spent: 35 minutes    Arnetia Bronk A Caleb Decock, MD [facility]   If 7PM-7AM, please contact night-coverage [email or web address]  08/29/2024, 7:29 AM

## 2024-08-30 DIAGNOSIS — T50905A Adverse effect of unspecified drugs, medicaments and biological substances, initial encounter: Secondary | ICD-10-CM | POA: Diagnosis not present

## 2024-08-30 DIAGNOSIS — D6959 Other secondary thrombocytopenia: Secondary | ICD-10-CM | POA: Diagnosis not present

## 2024-08-30 LAB — CBC
HCT: 36.1 % (ref 36.0–46.0)
Hemoglobin: 11.8 g/dL — ABNORMAL LOW (ref 12.0–15.0)
MCH: 29.6 pg (ref 26.0–34.0)
MCHC: 32.7 g/dL (ref 30.0–36.0)
MCV: 90.7 fL (ref 80.0–100.0)
Platelets: 35 K/uL — ABNORMAL LOW (ref 150–400)
RBC: 3.98 MIL/uL (ref 3.87–5.11)
RDW: 13.7 % (ref 11.5–15.5)
WBC: 15.9 K/uL — ABNORMAL HIGH (ref 4.0–10.5)
nRBC: 0 % (ref 0.0–0.2)

## 2024-08-30 MED ORDER — FAMOTIDINE 20 MG PO TABS
20.0000 mg | ORAL_TABLET | Freq: Every day | ORAL | Status: DC
  Administered 2024-08-30: 20 mg via ORAL
  Filled 2024-08-30: qty 1

## 2024-08-30 MED ORDER — FAMOTIDINE 20 MG PO TABS: 20.0000 mg | ORAL_TABLET | Freq: Every day | ORAL | 0 refills | Status: DC

## 2024-08-30 MED ORDER — PREDNISONE 20 MG PO TABS: 60.0000 mg | ORAL_TABLET | Freq: Every day | ORAL | 0 refills | Status: AC

## 2024-08-30 NOTE — Plan of Care (Signed)

## 2024-08-30 NOTE — Discharge Summary (Signed)
 Physician Discharge Summary   Patient: Samantha Long MRN: [id number] DOB: 2001/06/29  Admit date:     08/27/2024  Discharge date: 08/30/24  Discharge Physician: Owen DELENA Lore   PCP: Patient, No Pcp Per   Recommendations at discharge:   Needs CBC to follow platelet counts.  Needs follow up with Dr Sherrod for further care of thrombocytopenia. Further taper of prednisone .   Discharge Diagnoses: Principal Problem:   Drug-induced ITP Active Problems:   Thrombocytopenia   Purpura  Resolved Problems:   * No resolved hospital problems. *  Hospital Course: 23 year old with no past medical history who presents to the ED complaining of rash, she completed a course of Macrobid  2 days prior to admission, she subsequently developed a pruritic rash over the last day erythematous purple rash associated with pruritus involving bilateral upper extremity and upper portion of her chest.   Evaluation in the ED: show hemoglobin of 12 platelet counts less than 5 most recent platelet count 268 on 7/29.   Case was discussed with on-call hematology oncology Dr. Gatha who suspected ITP.  She was started on prednisone  1 mg/kg daily   Assessment and Plan: 1-severe thrombocytopenia: Thought to be related to drug-induced thrombocytopenia, -Also in the differential ITP, unlikely TTP peripheral smear negative for schistocytes. - Patient presented with a platelet counts less than 5, petechial rash following recent course of Macrobid . - Platelet counts increased to 35, per Dr Sherrod ok to discharge home.  - Started on prednisone , continue Received 1 unit of platelets Report less vaginal bleeding.        Consultants: Dr Sherrod.  Procedures performed: none Disposition: Home Diet recommendation:  Discharge Diet Orders (From admission, onward)     Start     Ordered   08/30/24 0000  Diet - low sodium heart healthy        08/30/24 1000           Regular diet DISCHARGE  MEDICATION: Allergies as of 08/30/2024       Reactions   Amoxicillin Hives   Has patient had a PCN reaction causing immediate rash, facial/tongue/throat swelling, SOB or lightheadedness with hypotension: yes Has patient had a PCN reaction causing severe rash involving mucus membranes or skin necrosis: no Has patient had a PCN reaction that required hospitalization: ed visit Has patient had a PCN reaction occurring within the last 10 years: no If all of the above answers are NO, then may proceed with Cephalosporin use.   Nitrofuran Derivatives Itching, Swelling   Lip swelling and urticaria        Medication List     STOP taking these medications    azelastine  0.1 % nasal spray Commonly known as: ASTELIN    fluconazole  150 MG tablet Commonly known as: Diflucan    metroNIDAZOLE  0.75 % vaginal gel Commonly known as: METROGEL    nitrofurantoin  (macrocrystal-monohydrate) 100 MG capsule Commonly known as: MACROBID    ondansetron  4 MG tablet Commonly known as: ZOFRAN    pantoprazole  40 MG tablet Commonly known as: Protonix    sucralfate  1 g tablet Commonly known as: Carafate        TAKE these medications    albuterol  108 (90 Base) MCG/ACT inhaler Commonly known as: VENTOLIN  HFA Inhale 1-2 puffs into the lungs every 6 (six) hours as needed for wheezing or shortness of breath.   famotidine  20 MG tablet Commonly known as: PEPCID  Take 1 tablet (20 mg total) by mouth daily.   predniSONE  20 MG tablet Commonly known as: DELTASONE  Take 3 tablets (60  mg total) by mouth daily with breakfast. Start taking on: August 31, 2024        Discharge Exam: Fredricka Weights   08/28/24 0005 08/29/24 0419 08/30/24 0530  Weight: 65.8 kg 61.7 kg 60.5 kg   General; NAD  Condition at discharge: stable  The results of significant diagnostics from this hospitalization (including imaging, microbiology, ancillary and laboratory) are listed below for reference.   Imaging Studies: No  results found.  Microbiology: Results for orders placed or performed during the hospital encounter of 05/31/24  Urine Culture     Status: Abnormal   Collection Time: 05/31/24 12:12 PM   Specimen: Urine, Clean Catch  Result Value Ref Range Status   Specimen Description URINE, CLEAN CATCH  Final   Special Requests NONE  Final   Culture (A)  Final    >=100,000 COLONIES/mL GROUP B STREP(S.AGALACTIAE)ISOLATED TESTING AGAINST S. AGALACTIAE NOT ROUTINELY PERFORMED DUE TO PREDICTABILITY OF AMP/PEN/VAN SUSCEPTIBILITY. Performed at [facility], [address], [place], [place] [address]    Report Status 06/01/2024 FINAL  Final    Labs: CBC: Recent Labs  Lab 08/28/24 0128 08/28/24 0246 08/28/24 0750 08/28/24 1610 08/29/24 0350 08/29/24 1433 08/30/24 0415  WBC 7.5  --  9.5 10.4 11.4*  --  15.9*  NEUTROABS 5.3  --  8.1*  --  7.5  --   --   HGB 12.3  --  12.6 12.3 12.2 13.6 11.8*  HCT 39.1  --  39.2 38.4 38.1 42.4 36.1  MCV 92.9  --  92.0 92.1 92.7  --  90.7  PLT <5* <5* <5*  <5* 6* 6*  --  35*   Basic Metabolic Panel: Recent Labs  Lab 08/28/24 0230 08/28/24 1052 08/29/24 0350  NA 136 136 142  K 4.2 4.9 3.8  CL 102 101 104  CO2 23 22 23   GLUCOSE 82 151* 92  BUN 9 8 9   CREATININE 0.95 0.89 0.65  CALCIUM 9.2 9.9 9.0  MG 2.2  --   --    Liver Function Tests: Recent Labs  Lab 08/28/24 0230 08/28/24 1052  AST 33 44*  ALT 25 37  ALKPHOS 74 86  BILITOT 0.4 0.4  PROT 7.1 7.9  ALBUMIN 4.2 4.5   CBG: No results for input(s): GLUCAP in the last 168 hours.  Discharge time spent: greater than 30 minutes.  Signed: Owen DELENA Lore, MD [facility] 08/30/2024

## 2024-09-02 ENCOUNTER — Telehealth: Payer: Self-pay | Admitting: Internal Medicine

## 2024-09-02 NOTE — Telephone Encounter (Signed)
 Scheduled patient appointments, attempted to call but the call could not be completed.

## 2024-09-06 ENCOUNTER — Other Ambulatory Visit: Payer: Self-pay

## 2024-09-06 DIAGNOSIS — D696 Thrombocytopenia, unspecified: Secondary | ICD-10-CM

## 2024-09-07 ENCOUNTER — Inpatient Hospital Stay: Attending: Internal Medicine

## 2024-09-07 ENCOUNTER — Ambulatory Visit: Admitting: Internal Medicine

## 2024-09-07 VITALS — BP 112/68 | HR 62 | Temp 97.9°F | Resp 17 | Ht 62.0 in | Wt 146.0 lb

## 2024-09-07 DIAGNOSIS — N920 Excessive and frequent menstruation with regular cycle: Secondary | ICD-10-CM | POA: Insufficient documentation

## 2024-09-07 DIAGNOSIS — Z7952 Long term (current) use of systemic steroids: Secondary | ICD-10-CM | POA: Diagnosis not present

## 2024-09-07 DIAGNOSIS — D500 Iron deficiency anemia secondary to blood loss (chronic): Secondary | ICD-10-CM | POA: Diagnosis not present

## 2024-09-07 DIAGNOSIS — D696 Thrombocytopenia, unspecified: Secondary | ICD-10-CM | POA: Insufficient documentation

## 2024-09-07 LAB — CBC WITH DIFFERENTIAL (CANCER CENTER ONLY)
Abs Immature Granulocytes: 0.06 K/uL (ref 0.00–0.07)
Basophils Absolute: 0 K/uL (ref 0.0–0.1)
Basophils Relative: 0 %
Eosinophils Absolute: 0.4 K/uL (ref 0.0–0.5)
Eosinophils Relative: 3 %
HCT: 32.8 % — ABNORMAL LOW (ref 36.0–46.0)
Hemoglobin: 11.1 g/dL — ABNORMAL LOW (ref 12.0–15.0)
Immature Granulocytes: 0 %
Lymphocytes Relative: 38 %
Lymphs Abs: 5.5 K/uL — ABNORMAL HIGH (ref 0.7–4.0)
MCH: 30.7 pg (ref 26.0–34.0)
MCHC: 33.8 g/dL (ref 30.0–36.0)
MCV: 90.6 fL (ref 80.0–100.0)
Monocytes Absolute: 0.9 K/uL (ref 0.1–1.0)
Monocytes Relative: 6 %
Neutro Abs: 7.5 K/uL (ref 1.7–7.7)
Neutrophils Relative %: 53 %
Platelet Count: 428 K/uL — ABNORMAL HIGH (ref 150–400)
RBC: 3.62 MIL/uL — ABNORMAL LOW (ref 3.87–5.11)
RDW: 14.4 % (ref 11.5–15.5)
WBC Count: 14.3 K/uL — ABNORMAL HIGH (ref 4.0–10.5)
nRBC: 0 % (ref 0.0–0.2)

## 2024-09-07 LAB — LACTATE DEHYDROGENASE: LDH: 180 U/L (ref 98–192)

## 2024-09-07 NOTE — Progress Notes (Signed)
 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  PRIOR THERAPY: taper dose of prednisone .  CURRENT THERAPY: None  INTERVAL HISTORY: Samantha Long 23 y.o. female returns to the clinic today for hospital follow-up visit accompanied by her boyfriend. Discussed the use of AI scribe software for clinical note transcription with the patient, who gave verbal consent to proceed.  History of Present Illness Samantha Long is a 23 year old female with thrombocytopenia who presents for a hospital follow-up visit with repeat blood work. She is accompanied by her boyfriend.  She has a history of thrombocytopenia, suspected to be drug-induced, possibly related to nitrofurantoin  used for a urinary tract infection. During her hospital stay, her platelet count was less than 5,000. She was treated with a tapered dose of prednisone  and is currently taking 10 mg once daily. She is unsure of the remaining quantity of her medication.  Her most recent platelet count is 428,000.     MEDICAL HISTORY: Past Medical History:  Diagnosis Date   Chlamydia 09/24/2018   Gonorrhea 09/24/2018   Trichimoniasis 09/24/2018    ALLERGIES:  is allergic to amoxicillin and nitrofuran derivatives.  MEDICATIONS:  Current Outpatient Medications  Medication Sig Dispense Refill   albuterol  (VENTOLIN  HFA) 108 (90 Base) MCG/ACT inhaler Inhale 1-2 puffs into the lungs every 6 (six) hours as needed for wheezing or shortness of breath. 6.7 g 0   famotidine  (PEPCID ) 20 MG tablet Take 1 tablet (20 mg total) by mouth daily. 30 tablet 0   predniSONE  (DELTASONE ) 20 MG tablet Take 3 tablets (60 mg total) by mouth daily with breakfast. 90 tablet 0   No current facility-administered medications for this visit.    SURGICAL HISTORY: No past surgical history on file.  REVIEW OF  SYSTEMS:  A comprehensive review of systems was negative.   PHYSICAL EXAMINATION: General appearance: alert, cooperative, and no distress Head: Normocephalic, without obvious abnormality, atraumatic Neck: no adenopathy, no JVD, supple, symmetrical, trachea midline, and thyroid not enlarged, symmetric, no tenderness/mass/nodules Lymph nodes: Cervical, supraclavicular, and axillary nodes normal. Resp: clear to auscultation bilaterally Back: symmetric, no curvature. ROM normal. No CVA tenderness. Cardio: regular rate and rhythm, S1, S2 normal, no murmur, click, rub or gallop GI: soft, non-tender; bowel sounds normal; no masses,  no organomegaly Extremities: extremities normal, atraumatic, no cyanosis or edema  ECOG PERFORMANCE STATUS: 0 - Asymptomatic  Blood pressure 112/68, pulse 62, temperature 97.9 F (36.6 C), resp. rate 17, height 5' 2 (1.575 m), weight 146 lb (66.2 kg), last menstrual period 08/11/2024, SpO2 98%.  LABORATORY DATA: Lab Results  Component Value Date   WBC 14.3 (H) 09/07/2024   HGB 11.1 (L) 09/07/2024   HCT 32.8 (L) 09/07/2024   MCV 90.6 09/07/2024   PLT 428 (H) 09/07/2024      Chemistry      Component Value Date/Time   NA 142 08/29/2024 0350   K 3.8 08/29/2024 0350   CL 104 08/29/2024 0350   CO2 23 08/29/2024 0350   BUN 9 08/29/2024 0350   CREATININE 0.65 08/29/2024 0350   CREATININE 0.73 11/02/2018 0916      Component Value Date/Time   CALCIUM 9.0 08/29/2024 0350   ALKPHOS 86 08/28/2024 1052   AST 44 (H) 08/28/2024 1052   ALT 37 08/28/2024 1052   BILITOT 0.4 08/28/2024 1052  RADIOGRAPHIC STUDIES: No results found.  ASSESSMENT AND PLAN:  Assessment and Plan Assessment & Plan Drug-induced thrombocytopenia Thrombocytopenia likely secondary to nitrofurantoin  used for a urinary tract infection. Initial platelet count was less than 5,000, now improved to 428,000 after treatment with prednisone . Current platelet count is higher than desired,  indicating a good response to treatment. Differential diagnosis includes idiopathic thrombocytopenic purpura (ITP), but drug-induced etiology is more likely. - Continue prednisone  taper until completion of remaining tablets. - Discontinue prednisone  after completing current supply. - Schedule follow-up appointment in 3 months to reassess platelet count. - Instruct to call immediately if petechiae or other symptoms reappear. She also has mild iron deficiency anemia secondary to menstrual loss.  It was advised for her to take over-the-counter ferrous sulfate 2-3 times a week The patient was advised to call immediately if she has any other concerning symptoms in the interval.  The patient voices understanding of current disease status and treatment options and is in agreement with the current care plan.  All questions were answered. The patient knows to call the clinic with any problems, questions or concerns. We can certainly see the patient much sooner if necessary.  The total time spent in the appointment was 20 minutes including review of chart and various tests results, discussions about plan of care and coordination of care plan .   Disclaimer: This note was dictated with voice recognition software. Similar sounding words can inadvertently be transcribed and may not be corrected upon review.

## 2024-09-08 ENCOUNTER — Telehealth: Payer: Self-pay | Admitting: Internal Medicine

## 2024-09-08 NOTE — Telephone Encounter (Signed)
 Scheduled the patient for next lab and follow-up. Called and spoke with the patient, she is aware.

## 2024-09-13 ENCOUNTER — Telehealth: Admitting: Family Medicine

## 2024-09-13 DIAGNOSIS — B3731 Acute candidiasis of vulva and vagina: Secondary | ICD-10-CM | POA: Diagnosis not present

## 2024-09-13 MED ORDER — CLOTRIMAZOLE 2 % VA CREA: 1.0000 | TOPICAL_CREAM | Freq: Every day | VAGINAL | 0 refills | Status: AC

## 2024-09-13 NOTE — Patient Instructions (Signed)

## 2024-09-13 NOTE — Progress Notes (Signed)
 Virtual Visit Consent   Samantha Long, you are scheduled for a virtual visit with a [facility] provider today. Just as with appointments in the office, your consent must be obtained to participate. Your consent will be active for this visit and any virtual visit you may have with one of our providers in the next 365 days. If you have a MyChart account, a copy of this consent can be sent to you electronically.  As this is a virtual visit, video technology does not allow for your provider to perform a traditional examination. This may limit your provider's ability to fully assess your condition. If your provider identifies any concerns that need to be evaluated in person or the need to arrange testing (such as labs, EKG, etc.), we will make arrangements to do so. Although advances in technology are sophisticated, we cannot ensure that it will always work on either your end or our end. If the connection with a video visit is poor, the visit may have to be switched to a telephone visit. With either a video or telephone visit, we are not always able to ensure that we have a secure connection.  By engaging in this virtual visit, you consent to the provision of healthcare and authorize for your insurance to be billed (if applicable) for the services provided during this visit. Depending on your insurance coverage, you may receive a charge related to this service.  I need to obtain your verbal consent now. Are you willing to proceed with your visit today? Marquette Piontek has provided verbal consent on 09/13/2024 for a virtual visit (video or telephone). Loa Lamp, FNP  Date: 09/13/2024 4:56 PM   Virtual Visit via Video Note   I, Loa Lamp, connected with  Birdia Jaycox  ([id number], 07-20-2001) on 09/13/24 at  5:00 PM EDT by a video-enabled telemedicine application and verified that I am speaking with the correct person using two identifiers.  Location: Patient: Virtual Visit Location Patient:  Home Provider: Virtual Visit Location Provider: Home Office   I discussed the limitations of evaluation and management by telemedicine and the availability of in person appointments. The patient expressed understanding and agreed to proceed.    History of Present Illness: Samantha Long is a 23 y.o. who identifies as a female who was assigned female at birth, and is being seen today for a vaginal yeast infection with itching and thick white discharge. No abd pain or fever. She requests a cream. .  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  Nitrofuran Derivatives Itching and Swelling    Lip swelling and urticaria   Medications:  Current Outpatient Medications:    albuterol  (VENTOLIN  HFA) 108 (90 Base) MCG/ACT inhaler, Inhale 1-2 puffs into the lungs every 6 (six) hours as needed for wheezing or shortness of breath., Disp: 6.7 g, Rfl: 0   famotidine  (PEPCID ) 20 MG tablet, Take 1 tablet  (20 mg total) by mouth daily., Disp: 30 tablet, Rfl: 0   predniSONE  (DELTASONE ) 20 MG tablet, Take 3 tablets (60 mg total) by mouth daily with breakfast., Disp: 90 tablet, Rfl: 0  Observations/Objective: Patient is well-developed, well-nourished in no acute distress.  Resting comfortably  at home.  Head is normocephalic, atraumatic.  No labored breathing.  Speech is clear and coherent with logical content.  Patient is alert and oriented at baseline.    Assessment and Plan: 1. Candidiasis of vagina (Primary)  See educational information, UC or gyn as needed.   Follow Up Instructions: I discussed the assessment and treatment plan with the patient. The patient was provided an opportunity to ask questions and all were answered. The patient agreed with the plan and demonstrated an understanding of the instructions.  A copy of instructions were sent to the patient via MyChart unless otherwise noted below.     The patient was advised to call back or seek an in-person evaluation if the symptoms worsen or if the condition fails to improve as anticipated.    Mister Krahenbuhl, FNP

## 2024-10-05 ENCOUNTER — Encounter (HOSPITAL_COMMUNITY): Payer: Self-pay

## 2024-10-05 ENCOUNTER — Ambulatory Visit (HOSPITAL_COMMUNITY)
Admission: EM | Admit: 2024-10-05 | Discharge: 2024-10-05 | Disposition: A | Discharge status: Home / Self Care | Attending: Nurse Practitioner | Admitting: Nurse Practitioner

## 2024-10-05 DIAGNOSIS — R051 Acute cough: Secondary | ICD-10-CM | POA: Diagnosis present

## 2024-10-05 DIAGNOSIS — J069 Acute upper respiratory infection, unspecified: Secondary | ICD-10-CM | POA: Insufficient documentation

## 2024-10-05 DIAGNOSIS — N898 Other specified noninflammatory disorders of vagina: Secondary | ICD-10-CM | POA: Diagnosis present

## 2024-10-05 DIAGNOSIS — Z113 Encounter for screening for infections with a predominantly sexual mode of transmission: Secondary | ICD-10-CM | POA: Insufficient documentation

## 2024-10-05 HISTORY — DX: Unspecified asthma, uncomplicated: J45.909

## 2024-10-05 LAB — POCT URINALYSIS DIP (MANUAL ENTRY)
Bilirubin, UA: NEGATIVE
Glucose, UA: NEGATIVE mg/dL
Ketones, POC UA: NEGATIVE mg/dL
Nitrite, UA: NEGATIVE
Protein Ur, POC: NEGATIVE mg/dL
Spec Grav, UA: 1.025 (ref 1.010–1.025)
Urobilinogen, UA: 0.2 U/dL
pH, UA: 6 (ref 5.0–8.0)

## 2024-10-05 LAB — POCT URINE PREGNANCY: Preg Test, Ur: NEGATIVE

## 2024-10-05 LAB — HIV ANTIBODY (ROUTINE TESTING W REFLEX): HIV Screen 4th Generation wRfx: NONREACTIVE

## 2024-10-05 MED ORDER — FLUCONAZOLE 150 MG PO TABS: 150.0000 mg | ORAL_TABLET | ORAL | 0 refills | Status: AC

## 2024-10-05 MED ORDER — CETIRIZINE-PSEUDOEPHEDRINE ER 5-120 MG PO TB12: 1.0000 | ORAL_TABLET | Freq: Every day | ORAL | 0 refills | Status: AC

## 2024-10-05 MED ORDER — PROMETHAZINE-DM 6.25-15 MG/5ML PO SYRP: 10.0000 mL | ORAL_SOLUTION | Freq: Four times a day (QID) | ORAL | 0 refills | Status: DC | PRN

## 2024-10-05 MED ORDER — METRONIDAZOLE 500 MG PO TABS
500.0000 mg | ORAL_TABLET | Freq: Two times a day (BID) | ORAL | 0 refills | Status: AC
Start: 2024-10-05 — End: 2024-10-12

## 2024-10-05 NOTE — ED Provider Notes (Signed)
 [facility]    CSN: [id number] Arrival date & time: 10/05/24  1450      History   Chief Complaint Chief Complaint  Patient presents with   Abdominal Pain   Cough   Sore Throat   Nasal Congestion   Headache   Back Pain    HPI Samantha Long is a 23 y.o. female.   Discussed the use of AI scribe software for clinical note transcription with the patient, who gave verbal consent to proceed.   The patient presents with a 4-day history of dry cough, sore throat, headache, nasal congestion, and lower back pain. She reports associated fever with a maximum temperature of 105F yesterday. She also notes mild shortness of breath and wheezing. She has been using cough drops and Alka-Seltzer, which provided some relief for fever and headache. She denies paroxysmal nocturnal dyspnea, rhinorrhea, sneezing, nausea, vomiting, or diarrhea.  Additionally, the patient reports increased urinary frequency over the past 4 days without pain, burning, or discomfort with urination. She also describes vaginal discharge with an odor for the same duration but denies itching or irritation. She is sexually active with one female partner over the past 3 months and reports intermittent condom use. She is not currently using birth control. Her last menstrual period began last week.  The following sections of the patient's history were reviewed and updated as appropriate: allergies, current medications, past family history, past medical history, past social history, past surgical history, and problem list.     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 No pertinent surgical history.  OB History   No obstetric history on file.      Home Medications    Prior to Admission medications   Medication Sig Start Date End Date Taking? Authorizing Provider  cetirizine -pseudoephedrine (ZYRTEC -D) 5-120 MG tablet Take 1 tablet by mouth daily with breakfast for 10 days. 10/05/24 10/15/24 Yes Iola Lukes, FNP  fluconazole  (DIFLUCAN ) 150 MG tablet Take 1 tablet (150 mg total) by mouth every 3 (three) days for 2 doses. 10/05/24 10/09/24 Yes Iola Lukes, FNP  metroNIDAZOLE  (FLAGYL ) 500 MG tablet Take 1 tablet (500 mg total) by mouth 2 (two) times daily for 7 days. 10/05/24 10/12/24 Yes Iola Lukes, FNP  promethazine-dextromethorphan (PROMETHAZINE-DM) 6.25-15 MG/5ML syrup Take 10 mLs by mouth every 6 (six) hours as needed for cough. 10/05/24  Yes Iola Lukes, FNP  albuterol  (VENTOLIN  HFA) 108 (90 Base) MCG/ACT inhaler Inhale 1-2 puffs into the lungs every 6 (six) hours as needed for wheezing or shortness of breath. 03/23/24   Reddick, Johnathan B, NP  famotidine  (PEPCID ) 20 MG tablet Take 1 tablet (20 mg total) by mouth daily. Patient not taking: Reported on 10/05/2024 08/30/24 09/29/24  Madelyne Owen LABOR, MD    Family History Family History  Problem Relation Age of Onset   Healthy Mother    Healthy Father  Social History Social History   Tobacco Use   Smoking status: Never    Passive exposure: Yes   Smokeless tobacco: Never  Vaping Use   Vaping status: Former  Substance Use Topics   Alcohol use: No   Drug use: Yes    Types: Marijuana    Comment: daily     Allergies   Amoxicillin and Nitrofuran derivatives   Review of Systems Review of  Systems  Constitutional:  Positive for chills and fever (TMAX 105).  HENT:  Positive for congestion and sore throat. Negative for postnasal drip, sinus pain and sneezing.   Respiratory:  Positive for cough, shortness of breath and wheezing.   Gastrointestinal:  Positive for abdominal pain. Negative for diarrhea, nausea and vomiting.  Genitourinary:  Positive for frequency and vaginal discharge (thick & white). Negative for dysuria and menstrual problem (LMP about a week ago).       Vaginal odor. No itching or irritation.    Musculoskeletal:  Positive for back pain (low) and myalgias.  Neurological:  Positive for headaches.  All other systems reviewed and are negative.    Physical Exam Triage Vital Signs ED Triage Vitals  Encounter Vitals Group     BP 10/05/24 1547 103/65     Girls Systolic BP Percentile --      Girls Diastolic BP Percentile --      Boys Systolic BP Percentile --      Boys Diastolic BP Percentile --      Pulse Rate 10/05/24 1547 66     Resp 10/05/24 1547 14     Temp 10/05/24 1547 98 F (36.7 C)     Temp Source 10/05/24 1547 Oral     SpO2 10/05/24 1547 98 %     Weight --      Height --      Head Circumference --      Peak Flow --      Pain Score 10/05/24 1546 6     Pain Loc --      Pain Education --      Exclude from Growth Chart --    No data found.  Updated Vital Signs BP 103/65 (BP Location: Left Arm)   Pulse 66   Temp 98 F (36.7 C) (Oral)   Resp 14   LMP 09/28/2024 (Approximate)   SpO2 98%   Visual Acuity Right Eye Distance:   Left Eye Distance:   Bilateral Distance:    Right Eye Near:   Left Eye Near:    Bilateral Near:     Physical Exam Constitutional:      General: She is not in acute distress.    Appearance: Normal appearance. She is not ill-appearing, toxic-appearing or diaphoretic.  HENT:     Head: Normocephalic.     Right Ear: Hearing, tympanic membrane, ear canal and external ear normal.     Left Ear: Hearing, tympanic  membrane, ear canal and external ear normal.     Nose: Nose normal.     Mouth/Throat:     Mouth: Mucous membranes are moist.     Pharynx: Oropharynx is clear. Uvula midline.  Eyes:     General: Vision grossly intact.     Conjunctiva/sclera: Conjunctivae normal.  Cardiovascular:     Rate and Rhythm: Normal rate.  Pulmonary:     Effort: Pulmonary effort is normal.  Abdominal:     Palpations: Abdomen is soft.     Tenderness: There is no abdominal tenderness.  Genitourinary:  Comments: Deferred; patient performed self-swab for Aptima testing  Musculoskeletal:        General: Normal range of motion.     Cervical back: Normal range of motion and neck supple.  Lymphadenopathy:     Cervical: No cervical adenopathy.  Skin:    General: Skin is warm and dry.  Neurological:     General: No focal deficit present.     Mental Status: She is alert and oriented to person, place, and time.  Psychiatric:        Mood and Affect: Mood normal.        Behavior: Behavior normal.      UC Treatments / Results  Labs (all labs ordered are listed, but only abnormal results are displayed) Labs Reviewed  POCT URINALYSIS DIP (MANUAL ENTRY) - Abnormal; Notable for the following components:      Result Value   Clarity, UA turbid (*)    Blood, UA trace-intact (*)    Leukocytes, UA Trace (*)    All other components within normal limits  URINE CULTURE  HIV ANTIBODY (ROUTINE TESTING W REFLEX)  RPR  POC COVID19/FLU A&B COMBO  POCT URINE PREGNANCY  CERVICOVAGINAL ANCILLARY ONLY    EKG   Radiology No results found.  Procedures Procedures (including critical care time)  Medications Ordered in UC Medications - No data to display  Initial Impression / Assessment and Plan / UC Course  I have reviewed the triage vital signs and the nursing notes.  Pertinent labs & imaging results that were available during my care of the patient were reviewed by me and considered in my medical decision making  (see chart for details).     The patient presents with a 4-day history of dry cough, sore throat, nasal congestion, headache, lower back pain, fever, and increased urinary frequency, along with new vaginal discharge with odor. On evaluation, she is alert, oriented, afebrile, and nontoxic in appearance. Urinalysis shows trace leukocytes and red blood cells with negative nitrites, not strongly suggestive of a urinary tract infection. COVID-19 and influenza testing were offered but declined by the patient. Genitourinary exam was deferred, and the patient performed a self-collected vaginal swab for bacterial vaginosis, yeast, and sexually transmitted infection testing. Additional labs, including urine culture, pregnancy test, HIV, and syphilis screening, are pending.  Given her presentation and symptom overlap, empiric treatment was initiated for common causes of vaginal discharge, including bacterial vaginosis and yeast infection, with Flagyl  and Diflucan  prescribed. The patient was advised to avoid alcohol while taking Flagyl . Zyrtec -D and Promethazine DM were prescribed for upper respiratory symptom relief. Supportive care was discussed, including rest, hydration, and use of over-the-counter medications as needed for fever or discomfort.  The patient was advised that further recommendations and treatment adjustments will depend on pending test results. She was instructed to monitor her symptoms and follow up if symptoms worsen, persist beyond one week, or if new concerns arise.  Today's evaluation has revealed no signs of a dangerous process. Discussed diagnosis with patient and/or guardian. Patient and/or guardian aware of their diagnosis, possible red flag symptoms to watch out for and need for close follow up. Patient and/or guardian understands verbal and written discharge instructions. Patient and/or guardian comfortable with plan and disposition.  Patient and/or guardian has a clear mental status at  this time, good insight into illness (after discussion and teaching) and has clear judgment to make decisions regarding their care  Documentation was completed with the aid of voice recognition software. Transcription may  contain typographical errors.   Final Clinical Impressions(s) / UC Diagnoses   Final diagnoses:  Acute cough  Vaginal discharge  Viral URI with cough  Screen for STD (sexually transmitted disease)     Discharge Instructions      You were seen today for cough, congestion, sore throat, fever, urinary frequency, vaginal discharge and vaginal odor. Your vital signs and exam were stable, and your urine test showed only mild irritation but no signs of a clear infection. You performed a self-swab today to check for bacterial, yeast, and sexually transmitted infections, and additional tests including urine culture, HIV, and syphilis are pending. You will be contacted if any results are positive or if your treatment plan needs to be adjusted.  Based on your symptoms, you were prescribed Flagyl  and Diflucan  to treat possible bacterial vaginosis or yeast infection. Do not drink alcohol while taking Flagyl , as it can cause nausea and vomiting. Take your medications exactly as prescribed and finish the full course even if you start feeling better. For your cough and congestion, you were prescribed Zyrtec -D and Promethazine DM to help with nasal and chest symptoms. Drink plenty of fluids, get extra rest, and use a humidifier or warm showers to ease throat irritation and congestion.   Follow up with your primary care provider if your symptoms do not improve within a few days, if your discharge or odor worsens, or if you continue to have cough or congestion beyond a week. Once your lab results are available, your provider may adjust your treatment or refer you to a specialist if needed. Go to the emergency department right away if you develop severe abdominal or pelvic pain, high fever that  does not improve with medication, shortness of breath, chest pain, vomiting, or any new or worsening symptoms.      ED Prescriptions     Medication Sig Dispense Auth. Provider   cetirizine -pseudoephedrine (ZYRTEC -D) 5-120 MG tablet Take 1 tablet by mouth daily with breakfast for 10 days. 10 tablet Iola Lukes, FNP   promethazine-dextromethorphan (PROMETHAZINE-DM) 6.25-15 MG/5ML syrup Take 10 mLs by mouth every 6 (six) hours as needed for cough. 118 mL Iola Lukes, FNP   metroNIDAZOLE  (FLAGYL ) 500 MG tablet Take 1 tablet (500 mg total) by mouth 2 (two) times daily for 7 days. 14 tablet Iola Lukes, FNP   fluconazole  (DIFLUCAN ) 150 MG tablet Take 1 tablet (150 mg total) by mouth every 3 (three) days for 2 doses. 2 tablet Iola Lukes, FNP      PDMP not reviewed this encounter.   Iola Lukes, [place] 10/05/24 [phone]

## 2024-10-05 NOTE — ED Notes (Signed)
 Patient refused Covid/flu test.

## 2024-10-05 NOTE — Discharge Instructions (Addendum)
 You were seen today for cough, congestion, sore throat, fever, urinary frequency, vaginal discharge and vaginal odor. Your vital signs and exam were stable, and your urine test showed only mild irritation but no signs of a clear infection. You performed a self-swab today to check for bacterial, yeast, and sexually transmitted infections, and additional tests including urine culture, HIV, and syphilis are pending. You will be contacted if any results are positive or if your treatment plan needs to be adjusted.  Based on your symptoms, you were prescribed Flagyl  and Diflucan  to treat possible bacterial vaginosis or yeast infection. Do not drink alcohol while taking Flagyl , as it can cause nausea and vomiting. Take your medications exactly as prescribed and finish the full course even if you start feeling better. For your cough and congestion, you were prescribed Zyrtec -D and Promethazine DM to help with nasal and chest symptoms. Drink plenty of fluids, get extra rest, and use a humidifier or warm showers to ease throat irritation and congestion.   Follow up with your primary care provider if your symptoms do not improve within a few days, if your discharge or odor worsens, or if you continue to have cough or congestion beyond a week. Once your lab results are available, your provider may adjust your treatment or refer you to a specialist if needed. Go to the emergency department right away if you develop severe abdominal or pelvic pain, high fever that does not improve with medication, shortness of breath, chest pain, vomiting, or any new or worsening symptoms.

## 2024-10-05 NOTE — ED Notes (Signed)
 Patient did not leave a cytology as instructed.

## 2024-10-05 NOTE — ED Triage Notes (Signed)
 Patient ac/o a non productive cough, sore throat, headache, nasal congestion, and bilateral lower back pain x 4 days.  Patient states she has not had any medications for her symptoms.

## 2024-10-06 LAB — URINE CULTURE: Culture: NO GROWTH

## 2024-10-06 LAB — RPR: RPR Ser Ql: NONREACTIVE

## 2024-10-07 ENCOUNTER — Ambulatory Visit (HOSPITAL_COMMUNITY): Payer: Self-pay

## 2024-10-23 ENCOUNTER — Ambulatory Visit
Admission: EM | Admit: 2024-10-23 | Discharge: 2024-10-23 | Disposition: A | Discharge status: Home / Self Care | Attending: Nurse Practitioner | Admitting: Nurse Practitioner

## 2024-10-23 DIAGNOSIS — Z113 Encounter for screening for infections with a predominantly sexual mode of transmission: Secondary | ICD-10-CM | POA: Insufficient documentation

## 2024-10-23 DIAGNOSIS — R112 Nausea with vomiting, unspecified: Secondary | ICD-10-CM | POA: Insufficient documentation

## 2024-10-23 DIAGNOSIS — M545 Low back pain, unspecified: Secondary | ICD-10-CM | POA: Diagnosis not present

## 2024-10-23 LAB — POCT URINE DIPSTICK
Bilirubin, UA: NEGATIVE
Glucose, UA: NEGATIVE mg/dL
Ketones, POC UA: NEGATIVE mg/dL
Leukocytes, UA: NEGATIVE
Nitrite, UA: NEGATIVE
POC PROTEIN,UA: NEGATIVE
Spec Grav, UA: 1.025 (ref 1.010–1.025)
Urobilinogen, UA: 0.2 U/dL
pH, UA: 7 (ref 5.0–8.0)

## 2024-10-23 LAB — POCT URINE PREGNANCY: Preg Test, Ur: NEGATIVE

## 2024-10-23 MED ORDER — LIDOCAINE 5 % EX PTCH: 1.0000 | MEDICATED_PATCH | CUTANEOUS | 0 refills | Status: DC

## 2024-10-23 MED ORDER — NAPROXEN 500 MG PO TABS: 500.0000 mg | ORAL_TABLET | Freq: Two times a day (BID) | ORAL | 0 refills | Status: DC | PRN

## 2024-10-23 MED ORDER — METHOCARBAMOL 500 MG PO TABS: 500.0000 mg | ORAL_TABLET | Freq: Two times a day (BID) | ORAL | 0 refills | Status: DC | PRN

## 2024-10-23 NOTE — ED Triage Notes (Signed)
 Pt present with c/o lower back pain.  States she was vomiting three days ago and today has side pain. Pt c/o chills and hot flashes.

## 2024-10-23 NOTE — Discharge Instructions (Signed)
 Start Robaxin  twice daily as needed for your back pain.  This is a muscle relaxer and can make you drowsy.  Do not drink alcohol or drive while on this medication.  You may take naproxen  twice daily as needed as well.  Take this with food.  Put the Lidoderm  patch on your lower back leave it in place for 12 hours and remove for 12 hours.  Heat to the back and lots of rest.  Please follow-up with your PCP if your symptoms do not improve.  Please go to the ER for any worsening symptoms.  I hope you feel better soon!

## 2024-10-23 NOTE — ED Provider Notes (Addendum)
 [facility]    CSN: [id number] Arrival date & time: 10/23/24  1425      History   Chief Complaint Chief Complaint  Patient presents with   Possible Pregnancy    HPI Samantha Long is a 23 y.o. female presents for back pain.  Patient reports 3 days of an intermittent bilateral lower back pain that does not radiate.  Denies any numbness/tingling/weakness of her lower extremities, no bowel or bladder incontinence, no saddle paresthesia.  No dysuria.  Patient is concerned for possible pregnancy risk.  Last menstrual cycle was October 22 and she is not on birth control.  She does states she was nauseous due to the pain with 1 episode of vomiting.  No history of kidney stones.  She has not taken any OTC treatments for symptoms.  Patient would like STD testing as well.  No symptoms including vaginal discharge or STD concern.  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 No pertinent surgical history.  OB History   No obstetric history on file.      Home Medications    Prior to Admission medications   Medication Sig Start Date End Date Taking? Authorizing Provider  lidocaine  (LIDODERM ) 5 % Place 1 patch onto the skin daily. Leave on affected area for 12 hours and remove for 12 hours 10/23/24  Yes  Antionne Enrique, Jodi R, NP  methocarbamol  (ROBAXIN ) 500 MG tablet Take 1 tablet (500 mg total) by mouth 2 (two) times daily as needed for muscle spasms. 10/23/24  Yes Gaige Fussner, Jodi R, NP  naproxen  (NAPROSYN ) 500 MG tablet Take 1 tablet (500 mg total) by mouth 2 (two) times daily as needed. 10/23/24  Yes Jairy Angulo, Jodi R, NP  albuterol  (VENTOLIN  HFA) 108 (90 Base) MCG/ACT inhaler Inhale 1-2 puffs into the lungs every 6 (six) hours as needed for wheezing or shortness of breath. 03/23/24   Reddick, Johnathan B, NP  famotidine  (PEPCID ) 20 MG tablet Take 1 tablet (20 mg total) by mouth daily. Patient not taking: Reported on 10/05/2024 08/30/24 09/29/24  Regalado, Belkys A, MD  promethazine-dextromethorphan (PROMETHAZINE-DM) 6.25-15 MG/5ML syrup Take 10 mLs by mouth every 6 (six) hours as needed for cough. 10/05/24   Iola Lukes, FNP    Family History Family History  Problem Relation Age of Onset   Healthy Mother    Healthy Father     Social History Social History   Tobacco Use   Smoking status: Never    Passive exposure: Yes   Smokeless tobacco: Never  Vaping Use   Vaping status: Former  Substance Use Topics   Alcohol use: No   Drug use: Yes    Types: Marijuana    Comment: daily     Allergies   Amoxicillin and Nitrofuran derivatives   Review of Systems Review of Systems  Musculoskeletal:  Positive for back pain.     Physical Exam Triage Vital Signs ED Triage Vitals  Encounter Vitals Group     BP 10/23/24 1518 116/74     Girls Systolic BP Percentile --      Girls Diastolic BP Percentile --      Boys Systolic BP Percentile --      Boys Diastolic BP Percentile --      Pulse Rate 10/23/24 1518 81     Resp 10/23/24 1518 18     Temp 10/23/24 1518 98.6 F (37 C)     Temp src --      SpO2 10/23/24 1518 95 %     Weight --      Height --      Head Circumference --      Peak Flow --      Pain Score 10/23/24 1517 4     Pain Loc --      Pain Education --      Exclude from Growth  Chart --    No data found.  Updated Vital Signs BP 116/74 (BP Location: Left Arm)   Pulse 81   Temp 98.6 F (37 C)   Resp 18   LMP 09/28/2024 (Approximate)   SpO2 95%   Visual Acuity Right Eye Distance:   Left Eye Distance:   Bilateral Distance:    Right Eye Near:   Left Eye Near:    Bilateral Near:     Physical Exam Vitals and nursing note reviewed.  Constitutional:      General: She is not in acute distress.    Appearance: Normal appearance. She is not ill-appearing.  HENT:     Head: Normocephalic and atraumatic.  Eyes:     Pupils: Pupils are equal, round, and reactive to light.  Cardiovascular:     Rate and Rhythm: Normal rate.  Pulmonary:     Effort: Pulmonary effort is normal.  Abdominal:     Tenderness: There is no right CVA tenderness or left CVA tenderness.  Musculoskeletal:     Lumbar back: Spasms and tenderness present. No swelling, edema, deformity, signs of trauma, lacerations or bony tenderness. Normal range of motion. Negative right straight leg raise test and negative left straight leg raise test. No scoliosis.       Back:     Comments: Strength 5 out of 5 bilateral lower extremities  Skin:    General: Skin is warm and dry.  Neurological:     General: No focal deficit present.     Mental Status: She is alert and oriented to person, place, and time.  Psychiatric:        Mood and Affect: Mood normal.        Behavior: Behavior normal.      UC Treatments / Results  Labs (all labs ordered are listed, but only abnormal results are displayed) Labs Reviewed  POCT URINE DIPSTICK - Abnormal; Notable for the following components:      Result Value   Clarity, UA hazy (*)    Blood, UA trace-intact (*)    All other components within normal limits  POCT URINE PREGNANCY  CERVICOVAGINAL ANCILLARY ONLY    EKG   Radiology No results found.  Procedures Procedures (including critical care time)  Medications Ordered in UC Medications - No data to  display  Initial Impression / Assessment and Plan / UC Course  I have reviewed the triage vital signs and the nursing notes.  Pertinent  labs & imaging results that were available during my care of the patient were reviewed by me and considered in my medical decision making (see chart for details).     Reviewed exam and symptoms with patient.  Negative urine hCG.  Urine negative for UTI, trace blood but patient has no CVAT.  Discussed musculoskeletal cause of pain.  Will do trial of Robaxin , naproxen  and Lidoderm  patch.  STD testing is ordered and will contact for any positive results.  Discussed heat rest and PCP follow-up if symptoms do not improve.  ER precautions reviewed. Final Clinical Impressions(s) / UC Diagnoses   Final diagnoses:  Nausea and vomiting, unspecified vomiting type  Acute bilateral low back pain without sciatica  Screening examination for STD (sexually transmitted disease)     Discharge Instructions      Start Robaxin  twice daily as needed for your back pain.  This is a muscle relaxer and can make you drowsy.  Do not drink alcohol or drive while on this medication.  You may take naproxen  twice daily as needed as well.  Take this with food.  Put the Lidoderm  patch on your lower back leave it in place for 12 hours and remove for 12 hours.  Heat to the back and lots of rest.  Please follow-up with your PCP if your symptoms do not improve.  Please go to the ER for any worsening symptoms.  I hope you feel better soon!    ED Prescriptions     Medication Sig Dispense Auth. Provider   methocarbamol  (ROBAXIN ) 500 MG tablet Take 1 tablet (500 mg total) by mouth 2 (two) times daily as needed for muscle spasms. 10 tablet Ninamarie Keel, Jodi R, NP   naproxen  (NAPROSYN ) 500 MG tablet Take 1 tablet (500 mg total) by mouth 2 (two) times daily as needed. 14 tablet Damyen Knoll, Jodi R, NP   lidocaine  (LIDODERM ) 5 % Place 1 patch onto the skin daily. Leave on affected area for 12 hours and remove  for 12 hours 10 patch Ehsan Corvin, Jodi R, NP      PDMP not reviewed this encounter.   Loreda Myla SAUNDERS, NP 10/23/24 1546    Loreda Myla SAUNDERS, NP 10/23/24 1556

## 2024-10-24 ENCOUNTER — Ambulatory Visit (HOSPITAL_COMMUNITY): Payer: Self-pay

## 2024-10-24 LAB — CERVICOVAGINAL ANCILLARY ONLY
Chlamydia: POSITIVE — AB
Comment: NEGATIVE
Comment: NEGATIVE
Comment: NORMAL
Neisseria Gonorrhea: NEGATIVE
Trichomonas: NEGATIVE

## 2024-10-24 MED ORDER — DOXYCYCLINE HYCLATE 100 MG PO TABS: 100.0000 mg | ORAL_TABLET | Freq: Two times a day (BID) | ORAL | 0 refills | Status: AC

## 2024-10-25 ENCOUNTER — Telehealth: Admitting: Family Medicine

## 2024-10-25 DIAGNOSIS — A749 Chlamydial infection, unspecified: Secondary | ICD-10-CM

## 2024-10-25 MED ORDER — AZITHROMYCIN 500 MG PO TABS: 1000.0000 mg | ORAL_TABLET | Freq: Once | ORAL | 0 refills | Status: AC

## 2024-10-25 NOTE — Progress Notes (Signed)
 Virtual Visit Consent   Samantha Long, you are scheduled for a virtual visit with a [facility] provider today. Just as with appointments in the office, your consent must be obtained to participate. Your consent will be active for this visit and any virtual visit you may have with one of our providers in the next 365 days. If you have a MyChart account, a copy of this consent can be sent to you electronically.  As this is a virtual visit, video technology does not allow for your provider to perform a traditional examination. This may limit your provider's ability to fully assess your condition. If your provider identifies any concerns that need to be evaluated in person or the need to arrange testing (such as labs, EKG, etc.), we will make arrangements to do so. Although advances in technology are sophisticated, we cannot ensure that it will always work on either your end or our end. If the connection with a video visit is poor, the visit may have to be switched to a telephone visit. With either a video or telephone visit, we are not always able to ensure that we have a secure connection.  By engaging in this virtual visit, you consent to the provision of healthcare and authorize for your insurance to be billed (if applicable) for the services provided during this visit. Depending on your insurance coverage, you may receive a charge related to this service.  I need to obtain your verbal consent now. Are you willing to proceed with your visit today? [name] has provided verbal consent on 10/25/2024 for a virtual visit (video or telephone). [name], NP  Date: 10/25/2024 8:37 AM   Virtual Visit via Video Note   I, [name], connected with  Samantha Long  ([id number], [birth year]) on 10/25/24 at  8:30 AM EST by a video-enabled telemedicine application and verified that I am speaking with the correct person using two identifiers.  Location: Patient: Virtual Visit Location Patient:  Home Provider: Virtual Visit Location Provider: Home Office   I discussed the limitations of evaluation and management by telemedicine and the availability of in person appointments. The patient expressed understanding and agreed to proceed.    History of Present Illness: Samantha Long is a 23 y.o. who identifies as a female who was assigned female at birth, and is being seen today for medication changes due to intolerance of doxycycline  being used to treat Chlamydia. She was seen at [facility] on 11/16. At that time no symptoms of STI or vaginal discharge noted- but she did request STI testing. Reports no changes in the last 2 days with symptoms that were reported in UC visit. Did have STI- once prior years ago- and reports symptoms are different at this time. But in chart review noted to have PID history as well. She has mostly back pain with this. Denies numbness/tingling/weakness of her lower extremities, no bowel or bladder incontinence, no saddle paresthesia. No UTI symptoms- neg UA at UC. No changes to menstrual cycle- was neg on HGC test at [facility].   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   Pelvic inflammatory disease (PID) 09/24/2018   Acquired acanthosis nigricans 09/20/2018   Obesity with body mass index (BMI)  in 95th to 98th percentile for age in pediatric patient 09/20/2018    Allergies:  Allergies  Allergen Reactions   Amoxicillin Hives    Has patient had a PCN reaction causing immediate rash, facial/tongue/throat swelling, SOB or lightheadedness with hypotension: yes Has patient had a PCN reaction causing severe rash involving mucus membranes or skin necrosis: no Has patient had a PCN  reaction that required hospitalization: ed visit Has patient had a PCN reaction occurring within the last 10 years: no If all of the above answers are NO, then may proceed with Cephalosporin use.    Nitrofuran Derivatives Itching and Swelling    Lip swelling and urticaria   Medications:  Current Outpatient Medications:    albuterol  (VENTOLIN  HFA) 108 (90 Base) MCG/ACT inhaler, Inhale 1-2 puffs into the lungs every 6 (six) hours as needed for wheezing or shortness of breath., Disp: 6.7 g, Rfl: 0   doxycycline  (VIBRA -TABS) 100 MG tablet, Take 1 tablet (100 mg total) by mouth 2 (two) times daily for 7 days., Disp: 14 tablet, Rfl: 0   famotidine  (PEPCID ) 20 MG tablet, Take 1 tablet (20 mg total) by mouth daily. (Patient not taking: Reported on 10/05/2024), Disp: 30 tablet, Rfl: 0   lidocaine  (LIDODERM ) 5 %, Place 1 patch onto the skin daily. Leave on affected area for 12 hours and remove for 12 hours, Disp: 10 patch, Rfl: 0   methocarbamol  (ROBAXIN ) 500 MG tablet, Take 1 tablet (500 mg total) by mouth 2 (two) times daily as needed for muscle spasms., Disp: 10 tablet, Rfl: 0   naproxen  (NAPROSYN ) 500 MG tablet, Take 1 tablet (500 mg total) by mouth 2 (two) times daily as needed., Disp: 14 tablet, Rfl: 0   promethazine-dextromethorphan (PROMETHAZINE-DM) 6.25-15 MG/5ML syrup, Take 10 mLs by mouth every 6 (six) hours as needed for cough., Disp: 118 mL, Rfl: 0  Observations/Objective: Patient is well-developed, well-nourished in no acute distress.  Resting comfortably  at home.  Head is normocephalic, atraumatic.  No labored breathing.  Speech is clear and coherent with logical content.  Patient is alert and oriented at baseline.    Assessment and Plan:  1. Chlamydia (Primary)  - azithromycin  (ZITHROMAX ) 500 MG tablet; Take 2 tablets (1,000 mg total) by mouth once for 1 dose.  Dispense: 2 tablet; Refill: 0   Medication change- doxy not tolerated, sp zithromax  given- resistance  discussed Strict in person symptoms and follow up given history.  Reviewed side effects, risks and benefits of medication.    Patient acknowledged agreement and understanding of the plan.   Past Medical, Surgical, Social History, Allergies, and Medications have been Reviewed.    Follow Up Instructions: I discussed the assessment and treatment plan with the patient. The patient was provided an opportunity to ask questions and all were answered. The patient agreed with the plan and demonstrated an understanding of the instructions.  A copy of instructions were sent to the patient via MyChart unless otherwise noted below.    The patient was advised to call back or seek an in-person evaluation if the symptoms worsen or if the condition fails to improve as anticipated.    [name], NP

## 2024-10-25 NOTE — Patient Instructions (Addendum)
 Samantha Long, thank you for joining Samantha CHRISTELLA Barefoot, NP for today's virtual visit.  While this provider is not your primary care provider (PCP), if your PCP is located in our provider database this encounter information will be shared with them immediately following your visit.   A [facility] MyChart account gives you access to today's visit and all your visits, tests, and labs performed at [facility]  click here if you don't have a [facility] MyChart account or go to [email or web address]  Consent: (Patient) Samantha Long provided verbal consent for this virtual visit at the beginning of the encounter.  Current Medications:  Current Outpatient Medications:    azithromycin  (ZITHROMAX ) 500 MG tablet, Take 2 tablets (1,000 mg total) by mouth once for 1 dose., Disp: 2 tablet, Rfl: 0   albuterol  (VENTOLIN  HFA) 108 (90 Base) MCG/ACT inhaler, Inhale 1-2 puffs into the lungs every 6 (six) hours as needed for wheezing or shortness of breath., Disp: 6.7 g, Rfl: 0   doxycycline  (VIBRA -TABS) 100 MG tablet, Take 1 tablet (100 mg total) by mouth 2 (two) times daily for 7 days., Disp: 14 tablet, Rfl: 0   famotidine  (PEPCID ) 20 MG tablet, Take 1 tablet (20 mg total) by mouth daily. (Patient not taking: Reported on 10/05/2024), Disp: 30 tablet, Rfl: 0   lidocaine  (LIDODERM ) 5 %, Place 1 patch onto the skin daily. Leave on affected area for 12 hours and remove for 12 hours, Disp: 10 patch, Rfl: 0   methocarbamol  (ROBAXIN ) 500 MG tablet, Take 1 tablet (500 mg total) by mouth 2 (two) times daily as needed for muscle spasms., Disp: 10 tablet, Rfl: 0   naproxen  (NAPROSYN ) 500 MG tablet, Take 1 tablet (500 mg total) by mouth 2 (two) times daily as needed., Disp: 14 tablet, Rfl: 0   promethazine -dextromethorphan (PROMETHAZINE -DM) 6.25-15 MG/5ML syrup, Take 10 mLs by mouth every 6 (six) hours as needed for cough., Disp: 118 mL, Rfl: 0   Medications ordered in this encounter:  Meds ordered this  encounter  Medications   azithromycin  (ZITHROMAX ) 500 MG tablet    Sig: Take 2 tablets (1,000 mg total) by mouth once for 1 dose.    Dispense:  2 tablet    Refill:  0    Supervising Provider:   LAMPTEY, PHILIP O [[id number]]     *If you need refills on other medications prior to your next appointment, please contact your pharmacy*  Follow-Up: Call back or seek an in-person evaluation if the symptoms worsen or if the condition fails to improve as anticipated.  [facility] [phone]  Other Instructions Chlamydia, Female Chlamydia is a sexually transmitted infection (STI). This infection spreads through sexual contact. The infection can grow in: The urethra. This is the part of the body that drains pee (urine) from the bladder. The cervix. This is the lowest part of the womb (uterus). The throat. The opening of the butt (rectum). This condition is not hard to treat. But if it is not treated, it can cause worse health problems. You may have a higher risk of not being able to have children. Also, if you are pregnant or get pregnant and have untreated chlamydia: It can cause serious problems during pregnancy. It can spread to your baby during delivery and cause your baby to have health problems. What are the causes?  This condition is caused by a germ (bacteria) called Chlamydia trachomatis. These germs are spread from an infected partner during sex. The infection can spread through  contact with the genitals, mouth, or the opening of the butt (rectum). What increases the risk? Not using a condom the right way. Not using a condom every time you have sex. Having a new sex partner. Having more than one sex partner. Being sexually active before age 32. What are the signs or symptoms? In some cases, there are no symptoms, especially early in the illness. If you get symptoms, they may include: Peeing often, or a burning feeling when you pee. Redness, soreness, or swelling  of the vagina or butt. Fluid (discharge) coming from the vagina or butt. Pain the belly (abdomen). Pain during sex. Bleeding between monthly periods or irregular periods. How is this treated? This condition is treated with antibiotic medicines. Follow these instructions at home: Sexual activity Tell your sex partner or partners about your infection. Sex partners are people you had oral, anal, or vaginal sex with within 60 days of when you started getting sick. They need treatment even if they do not feel or seem sick. Do not have sex until: You and your sex partners have been treated. Your doctor says it is okay. If you get just one dose of medicine, wait at least 7 days before having sex. General instructions Take over-the-counter and prescription medicines as told by your doctor. Finish your antibiotics even if you start to feel better. It is up to you to get your test results. Ask how to get your results when they are ready. Keep all follow-up visits. You may need tests after 3 months. How is this prevented? To lower your risk: Use latex or polyurethane condoms the right way. Do this every time you have sex. Do not have many sex partners. Ask if your sex partner got tested for STIs and had negative results. Get regular health screenings to check for STIs. Contact a doctor if: You get new symptoms. Your symptoms are getting worse or do not get better with treatment. You have a fever or chills. You have pain during sex. Your periods are irregular. You bleed between periods or after sex. You get flu-like symptoms. These may be: Night sweats. Sore throat. Muscle aches. You are unable to take your antibiotic medicine as prescribed. Summary Chlamydia is an infection that spreads through sexual contact. This condition is treated with antibiotics. If it is not treated, it can cause health problems. Your sex partners will also need to be treated. Do not have sex until both you and  your partner have been treated. Take all medicines as told and keep all follow-up visits. This information is not intended to replace advice given to you by your health care provider. Make sure you discuss any questions you have with your health care provider. Document Revised: 08/30/2021 Document Reviewed: 09/02/2021 Elsevier Patient Education  2024 Elsevier Inc.   If you have been instructed to have an in-person evaluation today at a local Urgent Care facility, please use the link below. It will take you to a list of all of our available [facility], including address, phone number and hours of operation. Please do not delay care.  [facility]  If you or a family member do not have a primary care provider, use the link below to schedule a visit and establish care. When you choose a [facility] primary care physician or advanced practice provider, you gain a long-term partner in health. Find a Primary Care Provider  Learn more about [facility] in-office and virtual care options: [facility] -  Get Care Now

## 2024-11-04 ENCOUNTER — Ambulatory Visit

## 2024-11-07 ENCOUNTER — Ambulatory Visit
Admission: EM | Admit: 2024-11-07 | Discharge: 2024-11-07 | Disposition: A | Discharge status: Home / Self Care | Attending: Family Medicine | Admitting: Family Medicine

## 2024-11-07 DIAGNOSIS — N898 Other specified noninflammatory disorders of vagina: Secondary | ICD-10-CM | POA: Insufficient documentation

## 2024-11-07 LAB — POCT URINE DIPSTICK
Bilirubin, UA: NEGATIVE
Glucose, UA: NEGATIVE mg/dL
Ketones, POC UA: NEGATIVE mg/dL
Nitrite, UA: NEGATIVE
POC PROTEIN,UA: NEGATIVE
Spec Grav, UA: 1.015 (ref 1.010–1.025)
Urobilinogen, UA: 0.2 U/dL
pH, UA: 6 (ref 5.0–8.0)

## 2024-11-07 LAB — POCT URINE PREGNANCY: Preg Test, Ur: NEGATIVE

## 2024-11-07 NOTE — Discharge Instructions (Signed)
 Your test results should be available tomorrow.  Our results team will let you know if you need any kind of treatment for any positive test results.

## 2024-11-07 NOTE — ED Triage Notes (Signed)
 Pt c/o lower abd pain, vaginal d/c x 4-5 days-no pain meds-NAD-steady gait

## 2024-11-07 NOTE — ED Provider Notes (Signed)
 [facility]  Note:  This document was prepared using Conservation officer, historic buildings and may include unintentional dictation errors.  MRN: [id number] DOB: 10-11-2001  Subjective:   Samantha Long is a 23 y.o. female presenting for 4 to 5-day history of persistent vaginal discharge.  Denies fever, n/v, abdominal pain, pelvic pain, rashes, dysuria, urinary frequency, hematuria.  Has a history of STIs, BV and yeast infection.  History of PID.  No current facility-administered medications for this encounter.  Current Outpatient Medications:    albuterol  (VENTOLIN  HFA) 108 (90 Base) MCG/ACT inhaler, Inhale 1-2 puffs into the lungs every 6 (six) hours as needed for wheezing or shortness of breath., Disp: 6.7 g, Rfl: 0   famotidine  (PEPCID ) 20 MG tablet, Take 1 tablet (20 mg total) by mouth daily. (Patient not taking: Reported on 10/05/2024), Disp: 30 tablet, Rfl: 0   lidocaine  (LIDODERM ) 5 %, Place 1 patch onto the skin daily. Leave on affected area for 12 hours and remove for 12 hours, Disp: 10 patch, Rfl: 0   methocarbamol  (ROBAXIN ) 500 MG tablet, Take 1 tablet (500 mg total) by mouth 2 (two) times daily as needed for muscle spasms., Disp: 10 tablet, Rfl: 0   naproxen  (NAPROSYN ) 500 MG tablet, Take 1 tablet (500 mg total) by mouth 2 (two) times daily as needed., Disp: 14 tablet, Rfl: 0   promethazine -dextromethorphan (PROMETHAZINE -DM) 6.25-15 MG/5ML syrup, Take 10 mLs by mouth every 6 (six) hours as needed for cough., Disp: 118 mL, Rfl: 0   Allergies  Allergen Reactions   Amoxicillin Hives    Has patient had a PCN reaction causing immediate rash, facial/tongue/throat swelling, SOB or lightheadedness with hypotension: yes Has patient had a PCN reaction causing severe rash involving mucus membranes or skin necrosis: no Has patient had a PCN reaction that required hospitalization: ed visit Has patient had a PCN reaction occurring within the last 10 years: no If all of  the above answers are NO, then may proceed with Cephalosporin use.    Nitrofuran Derivatives Itching and Swelling    Lip swelling and urticaria    Past Medical History:  Diagnosis Date   Asthma    Chlamydia 09/24/2018   Gonorrhea 09/24/2018   Trichimoniasis 09/24/2018     History reviewed. No pertinent surgical history.  Family History  Problem Relation Age of Onset   Healthy Mother    Healthy Father     Social History   Tobacco Use   Smoking status: Never    Passive exposure: Yes   Smokeless tobacco: Never  Vaping Use   Vaping status: Former  Substance Use Topics   Alcohol use: No   Drug use: Yes    Types: Marijuana    ROS   Objective:   Vitals: BP 106/70 (BP Location: Right Arm)   Pulse 66   Temp 98.2 F (36.8 C) (Oral)   Resp 16   LMP 10/29/2024   SpO2 98%   Physical Exam Constitutional:      General: She is not in acute distress.    Appearance: Normal appearance. She is well-developed. She is not ill-appearing, toxic-appearing or diaphoretic.  HENT:     Head: Normocephalic and atraumatic.     Nose: Nose normal.     Mouth/Throat:     Mouth: Mucous membranes are moist.     Pharynx: Oropharynx is clear.  Eyes:     General: No scleral icterus.       Right eye: No discharge.  Left eye: No discharge.     Extraocular Movements: Extraocular movements intact.     Conjunctiva/sclera: Conjunctivae normal.  Cardiovascular:     Rate and Rhythm: Normal rate.  Pulmonary:     Effort: Pulmonary effort is normal.  Abdominal:     General: Bowel sounds are normal. There is no distension.     Palpations: Abdomen is soft. There is no mass.     Tenderness: There is no abdominal tenderness. There is no right CVA tenderness, left CVA tenderness, guarding or rebound.  Skin:    General: Skin is warm and dry.  Neurological:     General: No focal deficit present.     Mental Status: She is alert and oriented to person, place, and time.  Psychiatric:         Mood and Affect: Mood normal.        Behavior: Behavior normal.        Thought Content: Thought content normal.        Judgment: Judgment normal.     Results for orders placed or performed during the hospital encounter of 11/07/24 (from the past 24 hours)  POCT URINE DIPSTICK     Status: Abnormal   Collection Time: 11/07/24  4:12 PM  Result Value Ref Range   Color, UA yellow yellow   Clarity, UA cloudy (A) clear   Glucose, UA negative negative mg/dL   Bilirubin, UA negative negative   Ketones, POC UA negative negative mg/dL   Spec Grav, UA 8.984 8.989 - 1.025   Blood, UA trace-intact (A) negative   pH, UA 6.0 5.0 - 8.0   POC PROTEIN,UA negative negative, trace   Urobilinogen, UA 0.2 0.2 or 1.0 E.U./dL   Nitrite, UA Negative Negative   Leukocytes, UA Small (1+) (A) Negative  POCT urine pregnancy     Status: None   Collection Time: 11/07/24  4:12 PM  Result Value Ref Range   Preg Test, Ur Negative Negative    Assessment and Plan :   PDMP not reviewed this encounter.  1. Vaginal discharge    Will base treatment off her results.  No signs of an acute gynecologic emergency.   Christopher Savannah, [place] 11/07/24 1747

## 2024-11-08 ENCOUNTER — Ambulatory Visit (HOSPITAL_COMMUNITY): Payer: Self-pay

## 2024-11-08 LAB — CERVICOVAGINAL ANCILLARY ONLY
Bacterial Vaginitis (gardnerella): POSITIVE — AB
Candida Glabrata: NEGATIVE
Candida Vaginitis: NEGATIVE
Chlamydia: NEGATIVE
Comment: NEGATIVE
Comment: NEGATIVE
Comment: NEGATIVE
Comment: NEGATIVE
Comment: NEGATIVE
Comment: NORMAL
Neisseria Gonorrhea: NEGATIVE
Trichomonas: NEGATIVE

## 2024-11-08 MED ORDER — METRONIDAZOLE 500 MG PO TABS: 500.0000 mg | ORAL_TABLET | Freq: Two times a day (BID) | ORAL | 0 refills | Status: DC

## 2024-11-08 MED ORDER — METRONIDAZOLE 0.75 % VA GEL: 1.0000 | Freq: Every day | VAGINAL | 0 refills | Status: AC

## 2024-11-09 LAB — MISC LABCORP TEST (SEND OUT): Labcorp test code: 180076

## 2024-11-10 MED ORDER — MOXIFLOXACIN HCL 400 MG PO TABS: 400.0000 mg | ORAL_TABLET | Freq: Every day | ORAL | 0 refills | Status: DC

## 2024-11-10 MED ORDER — DOXYCYCLINE HYCLATE 100 MG PO TABS: 100.0000 mg | ORAL_TABLET | Freq: Two times a day (BID) | ORAL | 0 refills | Status: DC

## 2024-11-10 NOTE — Telephone Encounter (Signed)
 Contacted patient by phone.  Verified identity using two identifiers.  Provided positive result.  Reviewed safe sex practices, notifying partners, and refraining from sexual activities for 7 days from time of treatment.  Patient verified understanding, all questions answered.

## 2024-11-14 ENCOUNTER — Telehealth: Admitting: Physician Assistant

## 2024-11-14 DIAGNOSIS — B379 Candidiasis, unspecified: Secondary | ICD-10-CM

## 2024-11-14 DIAGNOSIS — T3695XA Adverse effect of unspecified systemic antibiotic, initial encounter: Secondary | ICD-10-CM | POA: Diagnosis not present

## 2024-11-14 MED ORDER — FLUCONAZOLE 150 MG PO TABS: ORAL_TABLET | ORAL | 0 refills | Status: DC

## 2024-11-14 NOTE — Patient Instructions (Signed)
 Samantha Long, thank you for joining Elsie Velma Lunger, PA-C for today's virtual visit.  While this provider is not your primary care provider (PCP), if your PCP is located in our provider database this encounter information will be shared with them immediately following your visit.   A [facility] MyChart account gives you access to today's visit and all your visits, tests, and labs performed at [facility]  click here if you don't have a [facility] MyChart account or go to [email or web address]  Consent: (Patient) Samantha Long provided verbal consent for this virtual visit at the beginning of the encounter.  Current Medications:  Current Outpatient Medications:    fluconazole  (DIFLUCAN ) 150 MG tablet, Take 1 tablet PO once. Repeat in 3 days if needed., Disp: 2 tablet, Rfl: 0   albuterol  (VENTOLIN  HFA) 108 (90 Base) MCG/ACT inhaler, Inhale 1-2 puffs into the lungs every 6 (six) hours as needed for wheezing or shortness of breath., Disp: 6.7 g, Rfl: 0   doxycycline  (VIBRA -TABS) 100 MG tablet, Take 1 tablet (100 mg total) by mouth 2 (two) times daily for 7 days., Disp: 14 tablet, Rfl: 0   famotidine  (PEPCID ) 20 MG tablet, Take 1 tablet (20 mg total) by mouth daily. (Patient not taking: Reported on 10/05/2024), Disp: 30 tablet, Rfl: 0   lidocaine  (LIDODERM ) 5 %, Place 1 patch onto the skin daily. Leave on affected area for 12 hours and remove for 12 hours, Disp: 10 patch, Rfl: 0   methocarbamol  (ROBAXIN ) 500 MG tablet, Take 1 tablet (500 mg total) by mouth 2 (two) times daily as needed for muscle spasms., Disp: 10 tablet, Rfl: 0   metroNIDAZOLE  (FLAGYL ) 500 MG tablet, Take 1 tablet (500 mg total) by mouth 2 (two) times daily for 7 days., Disp: 14 tablet, Rfl: 0   moxifloxacin  (AVELOX ) 400 MG tablet, Take 1 tablet (400 mg total) by mouth daily at 8 pm for 7 days., Disp: 7 tablet, Rfl: 0   naproxen  (NAPROSYN ) 500 MG tablet, Take 1 tablet (500 mg total) by mouth 2 (two) times  daily as needed., Disp: 14 tablet, Rfl: 0   Medications ordered in this encounter:  Meds ordered this encounter  Medications   fluconazole  (DIFLUCAN ) 150 MG tablet    Sig: Take 1 tablet PO once. Repeat in 3 days if needed.    Dispense:  2 tablet    Refill:  0    Supervising Provider:   LAMPTEY, PHILIP O [[id number]]     *If you need refills on other medications prior to your next appointment, please contact your pharmacy*  Follow-Up: Call back or seek an in-person evaluation if the symptoms worsen or if the condition fails to improve as anticipated.  [facility] [phone]  Other Instructions Vaginal Yeast Infection, Adult  Vaginal yeast infection is a condition that causes vaginal discharge as well as soreness, swelling, and redness (inflammation) of the vagina. This is a common condition. Some women get this infection frequently. What are the causes? This condition is caused by a change in the normal balance of the yeast (Candida) and normal bacteria that live in the vagina. This change causes an overgrowth of yeast, which causes the inflammation. What increases the risk? The condition is more likely to develop in women who: Take antibiotic medicines. Have diabetes. Take birth control pills. Are pregnant. Douche often. Have a weak body defense system (immune system). Have been taking steroid medicines for a long time. Frequently wear tight clothing. What are the  signs or symptoms? Symptoms of this condition include: White, thick, creamy vaginal discharge. Swelling, itching, redness, and irritation of the vagina. The lips of the vagina (labia) may be affected as well. Pain or a burning feeling while urinating. Pain during sex. How is this diagnosed? This condition is diagnosed based on: Your medical history. A physical exam. A pelvic exam. Your health care provider will examine a sample of your vaginal discharge under a microscope. Your health care provider  may send this sample for testing to confirm the diagnosis. How is this treated? This condition is treated with medicine. Medicines may be over-the-counter or prescription. You may be told to use one or more of the following: Medicine that is taken by mouth (orally). Medicine that is applied as a cream (topically). Medicine that is inserted directly into the vagina (suppository). Follow these instructions at home: Take or apply over-the-counter and prescription medicines only as told by your health care provider. Do not use tampons until your health care provider approves. Do not have sex until your infection has cleared. Sex can prolong or worsen your symptoms of infection. Ask your health care provider when it is safe to resume sexual activity. Keep all follow-up visits. This is important. How is this prevented?  Do not wear tight clothes, such as pantyhose or tight pants. Wear breathable cotton underwear. Do not use douches, perfumed soap, creams, or powders. Wipe from front to back after using the toilet. If you have diabetes, keep your blood sugar levels under control. Ask your health care provider for other ways to prevent yeast infections. Contact a health care provider if: You have a fever. Your symptoms go away and then return. Your symptoms do not get better with treatment. Your symptoms get worse. You have new symptoms. You develop blisters in or around your vagina. You have blood coming from your vagina and it is not your menstrual period. You develop pain in your abdomen. Summary Vaginal yeast infection is a condition that causes discharge as well as soreness, swelling, and redness (inflammation) of the vagina. This condition is treated with medicine. Medicines may be over-the-counter or prescription. Take or apply over-the-counter and prescription medicines only as told by your health care provider. Do not douche. Resume sexual activity or use of tampons as instructed by  your health care provider. Contact a health care provider if your symptoms do not get better with treatment or your symptoms go away and then return. This information is not intended to replace advice given to you by your health care provider. Make sure you discuss any questions you have with your health care provider. Document Revised: 02/11/2021 Document Reviewed: 02/11/2021 Elsevier Patient Education  2024 Elsevier Inc.   If you have been instructed to have an in-person evaluation today at a local Urgent Care facility, please use the link below. It will take you to a list of all of our available [facility], including address, phone number and hours of operation. Please do not delay care.  [facility]  If you or a family member do not have a primary care provider, use the link below to schedule a visit and establish care. When you choose a [facility] primary care physician or advanced practice provider, you gain a long-term partner in health. Find a Primary Care Provider  Learn more about [facility]'s in-office and virtual care options: [facility] - Get Care Now

## 2024-11-14 NOTE — Progress Notes (Signed)
 Virtual Visit Consent   Samantha Long, you are scheduled for a virtual visit with a Samantha Long provider today. Just as with appointments in the office, your consent must be obtained to participate. Your consent will be active for this visit and any virtual visit you may have with one of our providers in the next 365 days. If you have a MyChart account, a copy of this consent can be sent to you electronically.  As this is a virtual visit, video technology does not allow for your provider to perform a traditional examination. This may limit your provider's ability to fully assess your condition. If your provider identifies any concerns that need to be evaluated in person or the need to arrange testing (such as labs, EKG, etc.), we will make arrangements to do so. Although advances in technology are sophisticated, we cannot ensure that it will always work on either your end or our end. If the connection with a video visit is poor, the visit may have to be switched to a telephone visit. With either a video or telephone visit, we are not always able to ensure that we have a secure connection.  By engaging in this virtual visit, you consent to the provision of healthcare and authorize for your insurance to be billed (if applicable) for the services provided during this visit. Depending on your insurance coverage, you may receive a charge related to this service.  I need to obtain your verbal consent now. Are you willing to proceed with your visit today? Samantha Long has provided verbal consent on 11/14/2024 for a virtual visit (video or telephone). Samantha Long, [place]  Date: 11/14/2024 3:21 PM   Virtual Visit via Video Note   I, Samantha Long, connected with  Samantha Long  ([id number], December 12, 2000) on 11/14/24 at  3:30 PM EST by a video-enabled telemedicine application and verified that I am speaking with the correct person using two identifiers.  Location: Patient: Virtual Visit Location  Patient: Home Provider: Virtual Visit Location Provider: Home Office   I discussed the limitations of evaluation and management by telemedicine and the availability of in person appointments. The patient expressed understanding and agreed to proceed.    History of Present Illness: Samantha Long is a 23 y.o. who identifies as a female who was assigned female at birth, and is being seen today for concern of yeast infection from antibiotic use. Recently evaluated in person and testing positive for BV and mycoplasma, started on multi-drug regimen as was treated for  Over past 3 days or so with thick, white discharge and substantial vaginal itching. Denies fever, pelvic pain. Recent negative urine preg test at time of UC visit.  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    Nitrofuran Derivatives Itching and Swelling    Lip swelling and urticaria   Medications:  Current Outpatient  Medications:    fluconazole  (DIFLUCAN ) 150 MG tablet, Take 1 tablet PO once. Repeat in 3 days if needed., Disp: 2 tablet, Rfl: 0   albuterol  (VENTOLIN  HFA) 108 (90 Base) MCG/ACT inhaler, Inhale 1-2 puffs into the lungs every 6 (six) hours as needed for wheezing or shortness of breath., Disp: 6.7 g, Rfl: 0   doxycycline  (VIBRA -TABS) 100 MG tablet, Take 1 tablet (100 mg total) by mouth 2 (two) times daily for 7 days., Disp: 14 tablet, Rfl: 0   famotidine  (PEPCID ) 20 MG tablet, Take 1 tablet (20 mg total) by mouth daily. (Patient not taking: Reported on 10/05/2024), Disp: 30 tablet, Rfl: 0   lidocaine  (LIDODERM ) 5 %, Place 1 patch onto the skin daily. Leave on affected area for 12 hours and remove for 12 hours, Disp: 10 patch, Rfl: 0   methocarbamol  (ROBAXIN ) 500 MG tablet, Take 1 tablet (500 mg total) by mouth 2 (two) times daily as needed for muscle spasms., Disp: 10 tablet, Rfl: 0   metroNIDAZOLE  (FLAGYL ) 500 MG tablet, Take 1 tablet (500 mg total) by mouth 2 (two) times daily for 7 days., Disp: 14 tablet, Rfl: 0   moxifloxacin  (AVELOX ) 400 MG tablet, Take 1 tablet (400 mg total) by mouth daily at 8 pm for 7 days., Disp: 7 tablet, Rfl: 0   naproxen  (NAPROSYN ) 500 MG tablet, Take 1 tablet (500 mg total) by mouth 2 (two) times daily as needed., Disp: 14 tablet, Rfl: 0  Observations/Objective: Patient is well-developed, well-nourished in no acute distress.  Resting comfortably at home.  Head is normocephalic, atraumatic.  No labored breathing.  Speech is clear and coherent with logical content.  Patient is alert and oriented at baseline.   Assessment and Plan: 1. Antibiotic-induced yeast infection (Primary) - fluconazole  (DIFLUCAN ) 150 MG tablet; Take 1 tablet PO once. Repeat in 3 days if needed.  Dispense: 2 tablet; Refill: 0  Start Diflucan  2 dose course per orders. In-person follow-up precautions reviewed with patient.   Follow Up Instructions: I discussed the assessment and treatment plan  with the patient. The patient was provided an opportunity to ask questions and all were answered. The patient agreed with the plan and demonstrated an understanding of the instructions.  A copy of instructions were sent to the patient via MyChart unless otherwise noted below.   The patient was advised to call back or seek an in-person evaluation if the symptoms worsen or if the condition fails to improve as anticipated.    Samantha Velma Lunger, PA-C

## 2024-11-15 ENCOUNTER — Other Ambulatory Visit: Payer: Self-pay

## 2024-11-15 ENCOUNTER — Ambulatory Visit (HOSPITAL_COMMUNITY)
Admission: EM | Admit: 2024-11-15 | Discharge: 2024-11-15 | Disposition: A | Discharge status: Other Acute Inpatient Hospital

## 2024-11-15 ENCOUNTER — Observation Stay (HOSPITAL_COMMUNITY)
Admission: EM | Admit: 2024-11-15 | Discharge: 2024-11-16 | Disposition: A | Attending: Internal Medicine | Admitting: Internal Medicine

## 2024-11-15 ENCOUNTER — Encounter (HOSPITAL_COMMUNITY): Payer: Self-pay | Admitting: Family Medicine

## 2024-11-15 DIAGNOSIS — T1491XA Suicide attempt, initial encounter: Secondary | ICD-10-CM

## 2024-11-15 DIAGNOSIS — T50902A Poisoning by unspecified drugs, medicaments and biological substances, intentional self-harm, initial encounter: Principal | ICD-10-CM | POA: Diagnosis present

## 2024-11-15 LAB — COMPREHENSIVE METABOLIC PANEL WITH GFR
ALT: 21 U/L (ref 0–44)
AST: 27 U/L (ref 15–41)
Albumin: 4 g/dL (ref 3.5–5.0)
Alkaline Phosphatase: 61 U/L (ref 38–126)
Anion gap: 7 (ref 5–15)
BUN: 9 mg/dL (ref 6–20)
CO2: 27 mmol/L (ref 22–32)
Calcium: 9.1 mg/dL (ref 8.9–10.3)
Chloride: 104 mmol/L (ref 98–111)
Creatinine, Ser: 0.84 mg/dL (ref 0.44–1.00)
GFR, Estimated: >60 mL/min (ref >60)
Glucose, Bld: 83 mg/dL (ref 70–99)
Potassium: 4 mmol/L (ref 3.5–5.1)
Sodium: 138 mmol/L (ref 135–145)
Total Bilirubin: 0.7 mg/dL (ref 0.0–1.2)
Total Protein: 7.4 g/dL (ref 6.5–8.1)

## 2024-11-15 LAB — CBC
HCT: 39.1 % (ref 36.0–46.0)
Hemoglobin: 13 g/dL (ref 12.0–15.0)
MCH: 31.4 pg (ref 26.0–34.0)
MCHC: 33.2 g/dL (ref 30.0–36.0)
MCV: 94.4 fL (ref 80.0–100.0)
Platelets: 253 K/uL (ref 150–400)
RBC: 4.14 MIL/uL (ref 3.87–5.11)
RDW: 14.5 % (ref 11.5–15.5)
WBC: 11.8 K/uL — ABNORMAL HIGH (ref 4.0–10.5)
nRBC: 0 % (ref 0.0–0.2)

## 2024-11-15 LAB — SALICYLATE LEVEL: Salicylate Lvl: <7 mg/dL — ABNORMAL LOW (ref 7.0–30.0)

## 2024-11-15 LAB — HCG, SERUM, QUALITATIVE: Preg, Serum: NEGATIVE

## 2024-11-15 LAB — MAGNESIUM: Magnesium: 1.7 mg/dL (ref 1.7–2.4)

## 2024-11-15 LAB — ETHANOL: Alcohol, Ethyl (B): <15 mg/dL (ref <15)

## 2024-11-15 LAB — RAPID URINE DRUG SCREEN, HOSP PERFORMED
Amphetamines: NOT DETECTED
Barbiturates: NOT DETECTED
Benzodiazepines: NOT DETECTED
Cocaine: NOT DETECTED
Opiates: POSITIVE — AB
Tetrahydrocannabinol: POSITIVE — AB

## 2024-11-15 LAB — ACETAMINOPHEN LEVEL: Acetaminophen (Tylenol), Serum: <10 ug/mL — ABNORMAL LOW (ref 10–30)

## 2024-11-15 MED ORDER — SODIUM CHLORIDE 0.9 % IV BOLUS
1000.0000 mL | Freq: Once | INTRAVENOUS | Status: AC
  Administered 2024-11-15: 1000 mL via INTRAVENOUS

## 2024-11-15 NOTE — ED Notes (Addendum)
 Spoke with Damien at [employer] and they suggest 24 hour observation; initial EKG and repeat at 6 hours; Cardiac/BP monitoring; Narcan PRN; IV fluids; antiemetics PRN; Tylenol  level at 4 hour post ingestion (2130); and they request a call back with tylenol  level results

## 2024-11-15 NOTE — ED Provider Triage Note (Signed)
 Emergency Medicine Provider Triage Evaluation Note  Samantha Long , a 23 y.o. female  was evaluated in triage.  Pt complains of suicide attempt. She took 6 percocet 10mg  about 2 hours ago in an attempt to kill herself. Nursing staff will call poison control. Was sent here from [facility]   Review of Systems  Positive: Suicidal, fatigued Negative: Chest pain, nausea   Physical Exam  BP 128/78 (BP Location: Right Arm)   Pulse 67   Temp 98.8 F (37.1 C) (Oral)   Resp 17   LMP 10/29/2024   SpO2 100%  Gen:   Awake, no distress   Resp:  Normal effort  MSK:   Moves extremities without difficulty    Medical Decision Making  Medically screening exam initiated at 7:42 PM.  Appropriate orders placed.  Samantha Long was informed that the remainder of the evaluation will be completed by another provider, this initial triage assessment does not replace that evaluation, and the importance of remaining in the ED until their evaluation is complete.     Samantha Duwaine CROME, DO 11/15/24 1942

## 2024-11-15 NOTE — ED Notes (Signed)
 Pt has been called for room 3 times no response.[id number]

## 2024-11-15 NOTE — ED Provider Notes (Incomplete)
 [facility] EMERGENCY DEPARTMENT AT [facility] Provider Note   CSN: [id number] Arrival date & time: 11/15/24  8165     Patient presents with: Drug Overdose   Dariyah Garduno is a 23 y.o. female self reportedly otherwise healthy presents to the ER today for evaluation    Drug Overdose       Prior to Admission medications   Medication Sig Start Date End Date Taking? Authorizing Provider  albuterol  (VENTOLIN  HFA) 108 (90 Base) MCG/ACT inhaler Inhale 1-2 puffs into the lungs every 6 (six) hours as needed for wheezing or shortness of breath. 03/23/24   Reddick, Johnathan B, NP  doxycycline  (VIBRA -TABS) 100 MG tablet Take 1 tablet (100 mg total) by mouth 2 (two) times daily for 7 days. 11/10/24 11/17/24  Hermanns, Ashlee P, PA-C  famotidine  (PEPCID ) 20 MG tablet Take 1 tablet (20 mg total) by mouth daily. Patient not taking: Reported on 10/05/2024 08/30/24 09/29/24  Regalado, Owen A, MD  fluconazole  (DIFLUCAN ) 150 MG tablet Take 1 tablet PO once. Repeat in 3 days if needed. 11/14/24   Gladis Elsie BROCKS, PA-C  lidocaine  (LIDODERM ) 5 % Place 1 patch onto the skin daily. Leave on affected area for 12 hours and remove for 12 hours 10/23/24   Mayer, Jodi R, NP  methocarbamol  (ROBAXIN ) 500 MG tablet Take 1 tablet (500 mg total) by mouth 2 (two) times daily as needed for muscle spasms. 10/23/24   Mayer, Jodi R, NP  metroNIDAZOLE  (FLAGYL ) 500 MG tablet Take 1 tablet (500 mg total) by mouth 2 (two) times daily for 7 days. 11/08/24 11/15/24  Vonna Sharlet POUR, MD  moxifloxacin  (AVELOX ) 400 MG tablet Take 1 tablet (400 mg total) by mouth daily at 8 pm for 7 days. 11/10/24 11/17/24  Hermanns, Ashlee P, PA-C  naproxen  (NAPROSYN ) 500 MG tablet Take 1 tablet (500 mg total) by mouth 2 (two) times daily as needed. 10/23/24   Loreda Myla SAUNDERS, NP    Allergies: Amoxicillin and Nitrofuran derivatives    Review of Systems  Updated Vital Signs BP 128/78 (BP Location: Right Arm)   Pulse 67   Temp 98.8  F (37.1 C) (Oral)   Resp 17   LMP 10/29/2024   SpO2 100%   Physical Exam  (all labs ordered are listed, but only abnormal results are displayed) Labs Reviewed  CBC - Abnormal; Notable for the following components:      Result Value   WBC 11.8 (*)    All other components within normal limits  RAPID URINE DRUG SCREEN, HOSP PERFORMED - Abnormal; Notable for the following components:   Opiates POSITIVE (*)    Tetrahydrocannabinol POSITIVE (*)    All other components within normal limits  SALICYLATE LEVEL - Abnormal; Notable for the following components:   Salicylate Lvl <7.0 (*)    All other components within normal limits  COMPREHENSIVE METABOLIC PANEL WITH GFR  ETHANOL  HCG, SERUM, QUALITATIVE  MAGNESIUM  ACETAMINOPHEN  LEVEL    EKG: EKG Interpretation Date/Time:  Tuesday November 15 2024 19:58:21 EST Ventricular Rate:  60 PR Interval:  136 QRS Duration:  80 QT Interval:  398 QTC Calculation: 398 R Axis:   58  Text Interpretation: Normal sinus rhythm Normal ECG No significant change since last tracing Confirmed by Emil Share [phone]) on 11/15/2024 8:48:22 PM  Radiology: No results found.  Procedures   Medications Ordered in the ED  sodium chloride  0.9 % bolus 1,000 mL (has no administration in time range)   Medical Decision  Making Amount and/or Complexity of Data Reviewed Labs: ordered.  Risk Decision regarding hospitalization.   23 y.o. female presents to the ER for evaluation of ***. Differential diagnosis includes but is not limited to ***. Vital signs ***. Physical exam as noted above.   Recommendations per patient control  I independently reviewed and interpreted the patient's labs. ***.  After consideration of the diagnostic results and the patients response to treatment, I feel that *** .   Emergency department workup does*** not suggest an emergent condition requiring admission or immediate intervention beyond what has been performed at this time.    ***We discussed the results of the labs/imaging. The plan is ***. We discussed strict return precautions and red flag symptoms. The patient verbalized their understanding and agrees to the plan. The patient is stable and being discharged home in good condition.  ***Portions of this report may have been transcribed using voice recognition software. Every effort was made to ensure accuracy; however, inadvertent computerized transcription errors may be present.    Final diagnoses:  None    ED Discharge Orders     None

## 2024-11-15 NOTE — ED Notes (Signed)
 [facility] called 911 for this pt after taking 6 percocet pills that she bought  off the street  pt unsure of dose.    Dr. Lawrnce in to eval pt.    Whittney RN [facility] charge RN notified of pending transfer.

## 2024-11-15 NOTE — ED Notes (Signed)
 Pt discharged to [facility] via [facility] [employer].   All pt's belongings including purse,phone and ID were given to parametric.  Pt able to walk out to ambulance on her own power.

## 2024-11-15 NOTE — ED Triage Notes (Signed)
 Pt bib ems from [facility] after taking 6 percocet approx 1.5 hours ago. Attempted suicide. Pt got them off of the street from somebody. Somnolent. GCS 15, pinpoint pupils. RR 14.  126/86 HR 74 RR 14 100% RA CBG 90

## 2024-11-15 NOTE — ED Notes (Signed)
 When you have time, Pennye Brasil [phone] (husband) would like to speak to pt. Pt. Called husband previously from hospital phone and he returned call. Thank you!

## 2024-11-15 NOTE — ED Provider Notes (Signed)
 Patient reports she took 6 percocet 1 hr ago in an attempt to end her life. She bought these off the street and she does not normally take these or any other opioid medication. Spoke to Dr. Gennaro in the ED who is the accepting physician at [facility] ED. Pt states she feels sleepy no other symptoms.

## 2024-11-16 ENCOUNTER — Inpatient Hospital Stay (HOSPITAL_COMMUNITY)
Admission: AD | Admit: 2024-11-16 | Discharge: 2024-11-18 | DRG: 885 | Disposition: A | Discharge status: Home / Self Care | Source: Intra-hospital

## 2024-11-16 ENCOUNTER — Encounter (HOSPITAL_COMMUNITY): Payer: Self-pay

## 2024-11-16 DIAGNOSIS — F321 Major depressive disorder, single episode, moderate: Secondary | ICD-10-CM | POA: Diagnosis not present

## 2024-11-16 DIAGNOSIS — T1491XA Suicide attempt, initial encounter: Secondary | ICD-10-CM | POA: Diagnosis not present

## 2024-11-16 DIAGNOSIS — A493 Mycoplasma infection, unspecified site: Secondary | ICD-10-CM | POA: Diagnosis present

## 2024-11-16 DIAGNOSIS — B9689 Other specified bacterial agents as the cause of diseases classified elsewhere: Secondary | ICD-10-CM

## 2024-11-16 HISTORY — DX: Other specified bacterial agents as the cause of diseases classified elsewhere: B96.89

## 2024-11-16 LAB — COMPREHENSIVE METABOLIC PANEL WITH GFR
ALT: 17 U/L (ref 0–44)
AST: 24 U/L (ref 15–41)
Albumin: 3.3 g/dL — ABNORMAL LOW (ref 3.5–5.0)
Alkaline Phosphatase: 50 U/L (ref 38–126)
Anion gap: 7 (ref 5–15)
BUN: 7 mg/dL (ref 6–20)
CO2: 25 mmol/L (ref 22–32)
Calcium: 8.3 mg/dL — ABNORMAL LOW (ref 8.9–10.3)
Chloride: 105 mmol/L (ref 98–111)
Creatinine, Ser: 0.83 mg/dL (ref 0.44–1.00)
GFR, Estimated: >60 mL/min (ref >60)
Glucose, Bld: 73 mg/dL (ref 70–99)
Potassium: 3.7 mmol/L (ref 3.5–5.1)
Sodium: 137 mmol/L (ref 135–145)
Total Bilirubin: 0.8 mg/dL (ref 0.0–1.2)
Total Protein: 5.8 g/dL — ABNORMAL LOW (ref 6.5–8.1)

## 2024-11-16 LAB — CBC
HCT: 34 % — ABNORMAL LOW (ref 36.0–46.0)
Hemoglobin: 10.9 g/dL — ABNORMAL LOW (ref 12.0–15.0)
MCH: 30.4 pg (ref 26.0–34.0)
MCHC: 32.1 g/dL (ref 30.0–36.0)
MCV: 95 fL (ref 80.0–100.0)
Platelets: 205 K/uL (ref 150–400)
RBC: 3.58 MIL/uL — ABNORMAL LOW (ref 3.87–5.11)
RDW: 14.5 % (ref 11.5–15.5)
WBC: 8.4 K/uL (ref 4.0–10.5)
nRBC: 0 % (ref 0.0–0.2)

## 2024-11-16 MED ORDER — MOXIFLOXACIN HCL 400 MG PO TABS: 400.0000 mg | ORAL_TABLET | Freq: Every day | ORAL | 0 refills | Status: DC

## 2024-11-16 MED ORDER — DOXYCYCLINE HYCLATE 100 MG PO TABS
100.0000 mg | ORAL_TABLET | Freq: Two times a day (BID) | ORAL | Status: DC
  Filled 2024-11-16: qty 1

## 2024-11-16 MED ORDER — ENOXAPARIN SODIUM 40 MG/0.4ML IJ SOSY
40.0000 mg | PREFILLED_SYRINGE | INTRAMUSCULAR | Status: DC
  Filled 2024-11-16: qty 0.4

## 2024-11-16 MED ORDER — HALOPERIDOL LACTATE 5 MG/ML IJ SOLN: 5.0000 mg | Freq: Three times a day (TID) | INTRAMUSCULAR | Status: DC | PRN

## 2024-11-16 MED ORDER — DIPHENHYDRAMINE HCL 25 MG PO CAPS: 50.0000 mg | ORAL_CAPSULE | Freq: Three times a day (TID) | ORAL | Status: DC | PRN

## 2024-11-16 MED ORDER — HALOPERIDOL LACTATE 5 MG/ML IJ SOLN: 10.0000 mg | Freq: Three times a day (TID) | INTRAMUSCULAR | Status: DC | PRN

## 2024-11-16 MED ORDER — MAGNESIUM HYDROXIDE 400 MG/5ML PO SUSP: 30.0000 mL | Freq: Every day | ORAL | Status: DC | PRN

## 2024-11-16 MED ORDER — SODIUM CHLORIDE 0.9% FLUSH: 3.0000 mL | Freq: Two times a day (BID) | INTRAVENOUS | Status: DC

## 2024-11-16 MED ORDER — DOXYCYCLINE HYCLATE 100 MG PO TABS: 100.0000 mg | ORAL_TABLET | Freq: Two times a day (BID) | ORAL | Status: DC

## 2024-11-16 MED ORDER — LORAZEPAM 2 MG/ML IJ SOLN: 2.0000 mg | Freq: Three times a day (TID) | INTRAMUSCULAR | Status: DC | PRN

## 2024-11-16 MED ORDER — DIPHENHYDRAMINE HCL 50 MG/ML IJ SOLN: 50.0000 mg | Freq: Three times a day (TID) | INTRAMUSCULAR | Status: DC | PRN

## 2024-11-16 MED ORDER — NALOXONE HCL 0.4 MG/ML IJ SOLN: 0.4000 mg | INTRAMUSCULAR | Status: DC | PRN

## 2024-11-16 MED ORDER — HYDROXYZINE HCL 25 MG PO TABS
25.0000 mg | ORAL_TABLET | Freq: Three times a day (TID) | ORAL | Status: DC | PRN
  Administered 2024-11-16: 25 mg via ORAL
  Filled 2024-11-16: qty 1

## 2024-11-16 MED ORDER — TRAZODONE HCL 50 MG PO TABS
50.0000 mg | ORAL_TABLET | Freq: Every evening | ORAL | Status: DC | PRN
  Administered 2024-11-16: 50 mg via ORAL
  Filled 2024-11-16: qty 1

## 2024-11-16 MED ORDER — ACETAMINOPHEN 325 MG PO TABS
650.0000 mg | ORAL_TABLET | Freq: Four times a day (QID) | ORAL | Status: DC | PRN
  Administered 2024-11-17: 650 mg via ORAL
  Filled 2024-11-16: qty 2

## 2024-11-16 MED ORDER — DIPHENHYDRAMINE HCL 25 MG PO CAPS
25.0000 mg | ORAL_CAPSULE | Freq: Once | ORAL | Status: AC | PRN
  Administered 2024-11-16: 25 mg via ORAL
  Filled 2024-11-16: qty 1

## 2024-11-16 MED ORDER — HALOPERIDOL 5 MG PO TABS: 5.0000 mg | ORAL_TABLET | Freq: Three times a day (TID) | ORAL | Status: DC | PRN

## 2024-11-16 MED ORDER — ALUM & MAG HYDROXIDE-SIMETH 200-200-20 MG/5ML PO SUSP: 30.0000 mL | ORAL | Status: DC | PRN

## 2024-11-16 NOTE — Tx Team (Signed)
 Initial Treatment Plan 11/16/2024 7:08 PM Rolin Seip FMW:[id number]    PATIENT STRESSORS: Other: not stated     PATIENT STRENGTHS: Ability for insight  Supportive family/friends    PATIENT IDENTIFIED PROBLEMS: Suicide attempt  Ineffective coping  Depression                  DISCHARGE CRITERIA:  Ability to meet basic life and health needs Verbal commitment to aftercare and medication compliance  PRELIMINARY DISCHARGE PLAN: Return to previous living arrangement Return to previous work or school arrangements  PATIENT/FAMILY INVOLVEMENT: This treatment plan has been presented to and reviewed with the patient, Samantha Long, has been given the opportunity to ask questions and make suggestions.  Samantha MALVA Doing, RN 11/16/2024, 7:08 PM

## 2024-11-16 NOTE — ED Notes (Signed)
 Pt notified this RN that she had some spots that itch, and requesting benadryl . Admitting MD made aware.

## 2024-11-16 NOTE — Group Note (Deleted)
 Date:  11/16/2024 Time:  8:08 PM  Group Topic/Focus:  Wrap-Up Group:   The focus of this group is to help patients review their daily goal of treatment and discuss progress on daily workbooks.    Participation Level:  Did Not Attend  Participation Quality:  none  Affect:  n/a  Cognitive:  n/a  Insight: None  Engagement in Group:  None  Modes of Intervention:  none  Additional Comments:  Pt did not attend NA meeting  Kenora Spayd A Aliyha Fornes 11/16/2024, 8:08 PM

## 2024-11-16 NOTE — ED Notes (Signed)
 1:1 sitter order placed on patient several hours ago. No sitter provided. I have 5 patients and am unable to be 1:1 with this patient due to risk of greater harm to all patients. The patient is in a hallway bed directly next to the nurses station.

## 2024-11-16 NOTE — ED Notes (Signed)
 Pt notified staff that her IV was hurting her arm and she was unable to sleep. Pt educated on importance of having IV access. Pt continued to refuse, and demanded to have the IV removed.

## 2024-11-16 NOTE — Progress Notes (Signed)
° °  Brief Progress Note   _____________________________________________________________________________________________________________  Patient Name: Samantha Long Patient DOB: 10-04-2001 Date: @TODAY @      Data: Reviewed vital signs, labs, and notes.    Action: No action required at this time.     Response:  Discharge orders written.  _____________________________________________________________________________________________________________  [facility] RN [employer]itor Vadis Slabach S Keanthony Poole Please contact us  directly via secure chat (search for [facility]) or by calling us  at [phone] [facility]).

## 2024-11-16 NOTE — ED Notes (Signed)
 Called safe trans ETA about 2 HOURS

## 2024-11-16 NOTE — Group Note (Signed)
 Date:  11/16/2024 Time:  8:41 PM  Group Topic/Focus: Gut flora Self Care:   The focus of this group is to help patients understand the importance of self-care in order to improve or restore emotional, physical, spiritual, interpersonal, and financial health.      Participation Level:  Did Not Attend   Juliene CHRISTELLA Huddle 11/16/2024, 8:41 PM

## 2024-11-16 NOTE — Plan of Care (Signed)
  Problem: Education: Goal: Emotional status will improve Outcome: Progressing   Problem: Education: Goal: Verbalization of understanding the information provided will improve Outcome: Progressing

## 2024-11-16 NOTE — [facility] Group Notes (Signed)
 Adult Psychoeducational Group Note  Date:  11/16/2024 Time:  8:42 PM  Group Topic/Focus:  Wrap-Up Group:   The focus of this group is to help patients review their daily goal of treatment and discuss progress on daily workbooks.  Participation Level:  Active  Participation Quality:  Appropriate  Affect:  Appropriate  Cognitive:  Appropriate  Insight: Appropriate  Engagement in Group:  Engaged  Modes of Intervention:  Discussion  Additional Comments:  Samantha Long said her day was 6. Goal for the day get comfortable in her new envirnment. Coping skills try to stay calm.  Favorite part of the day eating  Lang Donia Law 11/16/2024, 8:42 PM

## 2024-11-16 NOTE — ED Notes (Signed)
 AVS given and patient Dced from ED ins table condition with IVC paperwork, going with safe transport.

## 2024-11-16 NOTE — ED Notes (Signed)
 IVC paperwork given to nurse , in blue/orange pt will be going upstairs and paperwork will go with her

## 2024-11-16 NOTE — ED Notes (Signed)
 Report called to Ronnald RN at behavioral health.

## 2024-11-16 NOTE — ED Notes (Signed)
 Case # [id number] Envelope [id number] 3 copies of IVC paperwork in blue zone on purple folder 1 copy in medical records Original in red folder  Case number has been added under IVC Copy has been added to medical records

## 2024-11-16 NOTE — ED Notes (Signed)
 Findings brought in by police, handed off to blue zone sec

## 2024-11-16 NOTE — Consult Note (Addendum)
 [facility] Psychiatry New Face-to-Face Psychiatric Evaluation   Service Date: November 16, 2024 LOS:  LOS: 0 days    Assessment  Samantha Long is a 23 y.o. female admitted medically for 11/15/2024  6:34 PM for intentional overdose in a suicide attempt. She carries no formal psychiatric diagnoses previously and has a nonsignificant past medical history. Psychiatry was consulted for assessment following intentional overdose.  Symptoms during intake assessment described by patient include, low mood, issues with sleep onset ~ 5 hours of sleep, feelings of guilt/hopelessness, decreased energy psychomotor retardation, increased tearful episodes and suicidal ideations.  Symptoms are concerning for a major depressive episode versus adjustment disorder versus bipolar disorder, depressive episode based on some remote symptoms described in patient report. Patient does report that overdose was an intentional suicide attempt and she reports passive suicidal ideations upon my assessment. Patients cannabis use also suggestive of abuse. She meets criteria for patient psychiatric hospitalization and was placed under IVC on 11/16/2024.  She denies any former outpatient psychiatric medications, psych assessments, therapy or prior psych hospitalizations.   She will be transferred to the [facility] for symptomatic stabilization and further assessment. Will defer starting any medications to inpatient psychiatric team.  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 This decision is based on my review of the chart including patient's history  and current presentation, interview of the patient, mental status examination, and consideration of suicide risk including evaluating suicidal ideation, plan, intent, suicidal or self-harm behaviors, risk factors, and protective factors. This judgment is based on our ability to directly address suicide risk, implement suicide prevention strategies and develop a safety plan while the patient is in the clinical setting. Please contact our team if there is a concern that risk level has changed.  ## Medications:  -- Defers starting psychotropic medications to inpatient provider - Patient is medication nave  ## Medical Decision Making Capacity:  Not formally assessed  ## Further Work-up:  -- Follow-up collection of lipid panel, A1c, vitamin B9, vitamin B12, vitamin D  and TSH -- most recent EKG on 12/10 had QtC of 408 -- Pertinent labwork reviewed earlier this admission includes:  UDS positive for THC and opioids (patient took 6 Percocets prior to admission)  ## Disposition:  -- Patient currently under IVC and accepted to [facility] for psychiatric treatment, will attempt to transfer today -- Follow-up at substance resources for Cannabis Use   ## Behavioral / Environmental:  -- None  ##Legal Status -Patient reports she has multiple ongoing legal charges such as breaking and entering charge, possession of a dangerous weapon, and damage to bodily harm currently undergoing  Thank you for this consult request. Recommendations have been communicated to the primary team.  We will sign off and she will be transferred to inpatient psychiatric facility at this time.   PATTI OLDEN, MD   NEW history  Relevant Aspects of Hospital Course:  Admitted on 11/15/2024 for intentional overdose attempt on 6 Percocets.  Patient reported the time that she took 610 mg tablets of Percocet that she got illicitly.  Workup was rather unremarkable ethanol was negative, Tylenol  level negative, salicylate  level negative.   Mild leukocytosis of 11.8 on CBC.  Pregnancy test negative.  UDS positive for THC and opioids  Patient Report:   Upon assessment, patient reports that she was at school yesterday and her boyfriend was getting food, he was eventually stopped by the police due to the car not having any tags or insurance currently.  She reports that she just recently bought the car and boyfriend alerted her that the car was going to be towed. She reports she was having a difficult time coping and became overwhelmed.  Also reports that she felt that her boyfriend did not fully understand her concerns and that worsened her mood.  Patient reports that she took 610 mg tablets of Percocet with the intent of not waking up.  Regarding mood symptoms, patient reports for more than 2 weeks she has had a low mood, issues with sleep, feelings of guilt, hopelessness, decreased energy, psychomotor retardation, increased tearful episodes and suicidal ideations.  Patient reports a prior history of self-harm behaviors that started 3 months ago.  Patient reports that she make superficial lacerations on her arm and may also burn her arm and self-harm attempts.  She reports that she self harms daily.  She reports 1 prior suicide attempt where she attempted to cut her arm in hopes of losing enough blood. Patient reports passive suicidal ideations today.  Patient reports that mood symptoms have been going on since the beginning of the year due to multiple unanticipated deaths within her family.  She reports that her cousin died in 2024/06/28 and grandmother recently passed away in 07-29-2024 of this year.  Patient does report some anxiety related to financial obligations.  Patient reports that a big trigger in her attempt yesterday was due to the presumed cause still, she would be to get her car back.  Patient reports that she suspects it would be over $200 today and that causes her some anxiety.  Patient reports 1 prior episode suggestive of possible mania  4 months ago, where she had an increased mood along with energy and the lack of context of sleep, mood swings, spending money recklessly such as $2000 for a phone for her significant other, was talking faster than normal and was more impulsive.  Patient denies any hypersexual behavior and thoughts of grandiosity.   Patient report ports some intermittent auditory hallucinations, at times the patient hears her own voice versus a different voice, saying make the pain stopped.  Patient denies any command auditory hallucinations to harm others, denies visual hallucinations, thoughts of paranoia and patient does not appear to be responding to internal stimuli on my exam.  Patient denies history of sexual abuse, physical abuse, or verbal abuse suggestive of PTSD.  ROS:  Per HPI  Collateral information:  None obtained, patient did consent to speaking with mother   Psychiatric History:  Information collected from patient Patient denies any prior psychiatric medications, psychiatric providers, therapist or psychiatric hospitalizations.  Family psych history: Patient reports mom has a history of bipolar, MDD, anxiety Maternal aunt has a history of PTSD, bipolar disorder, schizophrenia and anxiety Patient unaware of any specific treatments her family members are receiving Patient is unaware of any prior suicide attempts Patient reports a substantial substance history of alcohol and marijuana in her maternal and paternal family  Social History:  Patient reports that she lived in [place]  then moved to [place]  years ago.  She reports that she got as far as 11th grade and then eventually received  her GED.  Patient reports that she currently goes to [employer] where she hopes to get her cosmetology license.  Patient reports that she currently stays with her mother here in [place].  Tobacco use: Denies Alcohol use: Denies Drug use: Daily marijuana use, to help keep her calm,  she reports she smokes up to 5 blunts a day  Family History:  The patient's family history includes Healthy in her father and mother.  Medical History: Past Medical History:  Diagnosis Date   Asthma    Chlamydia 09/24/2018   Gonorrhea 09/24/2018   Trichimoniasis 09/24/2018    Surgical History: History reviewed. No pertinent surgical history.  Medications:   Current Facility-Administered Medications:    doxycycline  (VIBRA -TABS) tablet 100 mg, 100 mg, Oral, BID, Opyd, Timothy S, MD   enoxaparin  (LOVENOX ) injection 40 mg, 40 mg, Subcutaneous, Q24H, Opyd, Timothy S, MD   naloxone (NARCAN) injection 0.4 mg, 0.4 mg, Intravenous, PRN, Opyd, Timothy S, MD   sodium chloride  flush (NS) 0.9 % injection 3 mL, 3 mL, Intravenous, Q12H, Opyd, Timothy S, MD  Current Outpatient Medications:    doxycycline  (VIBRA -TABS) 100 MG tablet, Take 1 tablet (100 mg total) by mouth 2 (two) times daily for 7 days., Disp: 14 tablet, Rfl: 0   [START ON 11/18/2024] moxifloxacin  (AVELOX ) 400 MG tablet, Take 1 tablet (400 mg total) by mouth daily at 8 pm., Disp: 7 tablet, Rfl: 0  Allergies: Allergies  Allergen Reactions   Amoxicillin Hives    Has patient had a PCN reaction causing immediate rash, facial/tongue/throat swelling, SOB or lightheadedness with hypotension: yes Has patient had a PCN reaction causing severe rash involving mucus membranes or skin necrosis: no Has patient had a PCN reaction that required hospitalization: ed visit Has patient had a PCN reaction occurring within the last 10 years: no If all of the above answers are NO, then may proceed with Cephalosporin use.    Nitrofuran Derivatives Itching and Swelling    Lip swelling and urticaria       Objective  Vital signs:  Temp:  [98.2 F (36.8 C)-98.8 F (37.1 C)] 98.4 F (36.9 C) (12/10 1247) Pulse Rate:  [66-80] 80 (12/10 1247) Resp:  [16-17] 16 (12/10 1247) BP: (107-128)/(59-78) 114/68 (12/10 1247) SpO2:  [95 %-100 %] 95 %  (12/10 1247) Weight:  [66.2 kg] 66.2 kg (12/10 0000)  Psychiatric Specialty Exam:  Presentation  General Appearance: Appropriate for Environment; Casual  Eye Contact:Good  Speech:Clear and Coherent  Speech Volume:Normal  Handedness:No data recorded  Mood and Affect  Mood:Depressed  Affect:Appropriate; Congruent; Restricted   Thought Process  Thought Processes:Coherent; Linear  Descriptions of Associations:Intact  Orientation:Full (Time, Place and Person)  Thought Content:Logical  History of Schizophrenia/Schizoaffective disorder:No data recorded Duration of Psychotic Symptoms:No data recorded Hallucinations:Hallucinations: None  Ideas of Reference:None  Suicidal Thoughts:Suicidal Thoughts: Yes, Passive  Homicidal Thoughts:Homicidal Thoughts: No   Sensorium  Memory:Immediate Fair  Judgment:Intact  Insight:Fair   Executive Functions  Concentration:Good  Attention Span:Good  Recall:Good  Fund of Knowledge:Good  Language:Good   Psychomotor Activity  Psychomotor Activity:Psychomotor Activity: Normal   Assets  Assets:Communication Skills; Desire for Improvement; Housing; Physical Health   Sleep  Sleep:Sleep: Fair    Physical Exam: Physical Exam Constitutional:      General: She is not in acute distress.    Appearance: She is not ill-appearing or diaphoretic.  Pulmonary:     Effort: Pulmonary effort is normal.  Musculoskeletal:        General: Normal range  of motion.  Neurological:     Mental Status: She is alert and oriented to person, place, and time.    Review of Systems  Constitutional:  Negative for chills and fever.  Respiratory:  Negative for cough.   Gastrointestinal:  Negative for nausea and vomiting.  Psychiatric/Behavioral:  Positive for depression, substance abuse and suicidal ideas. Negative for hallucinations. The patient has insomnia.    Blood pressure 114/68, pulse 80, temperature 98.4 F (36.9 C), resp. rate 16,  height 5' 2 (1.575 m), weight 66.2 kg, last menstrual period 10/29/2024, SpO2 95%. Body mass index is 26.69 kg/m.

## 2024-11-16 NOTE — H&P (Signed)
 History and Physical    Samantha Long FMW:[id number] DOB: 2001-09-26 DOA: 11/15/2024  PCP: Patient, No Pcp Per   Patient coming from: Home   Chief Complaint: Suicide attempt by drug overdose   HPI: Samantha Long is a 23 y.o. female who denies any significant past medical history presents for evaluation after an intentional drug overdose.   Patient reports that her car was towed, she did not have funds to get it back, became distraught over this, illicitly obtained 6 doses of what she was told were 10 mg Percocets, and took them at approximately 5 PM with intent to harm herself.  She was initially seen at [facility] but was directed to the ED for further evaluation.  She reports feeling sleepy but has no other complaints at this time.  She denies any alcohol or other recent substance use.  She now expresses regret over the ingestion.  ED Course: Upon arrival to the ED, patient is found to be afebrile and saturating well on room air with normal RR, normal HR, and stable BP.  Labs are most notable for opiates and THC on UDS, normal creatinine, normal LFTs, WBC 11,800, undetectable acetaminophen , undetectable salicylates, and undetectable ethanol.  IVC paperwork was filed by the ED PA and the patient was given a liter of NS in the ED.  Review of Systems:  All other systems reviewed and apart from HPI, are negative.  Past Medical History:  Diagnosis Date   Asthma    Chlamydia 09/24/2018   Gonorrhea 09/24/2018   Trichimoniasis 09/24/2018    History reviewed. No pertinent surgical history.  Social History:   reports that she has never smoked. She has been exposed to tobacco smoke. She has never used smokeless tobacco. She reports current drug use. Drug: Marijuana. She reports that she does not drink alcohol.  Allergies  Allergen Reactions   Amoxicillin Hives    Has patient had a PCN reaction causing immediate rash, facial/tongue/throat swelling, SOB or  lightheadedness with hypotension: yes Has patient had a PCN reaction causing severe rash involving mucus membranes or skin necrosis: no Has patient had a PCN reaction that required hospitalization: ed visit Has patient had a PCN reaction occurring within the last 10 years: no If all of the above answers are NO, then may proceed with Cephalosporin use.    Nitrofuran Derivatives Itching and Swelling    Lip swelling and urticaria    Family History  Problem Relation Age of Onset   Healthy Mother    Healthy Father      Prior to Admission medications   Medication Sig Start Date End Date Taking? Authorizing Provider  albuterol  (VENTOLIN  HFA) 108 (90 Base) MCG/ACT inhaler Inhale 1-2 puffs into the lungs every 6 (six) hours as needed for wheezing or shortness of breath. 03/23/24   Reddick, Johnathan B, NP  doxycycline  (VIBRA -TABS) 100 MG tablet Take 1 tablet (100 mg total) by mouth 2 (two) times daily for 7 days. 11/10/24 11/17/24  Hermanns, Ashlee P, PA-C  famotidine  (PEPCID ) 20 MG tablet Take 1 tablet (20 mg total) by mouth daily. Patient not taking: Reported on 10/05/2024 08/30/24 09/29/24  Regalado, Owen A, MD  fluconazole  (DIFLUCAN ) 150 MG tablet Take 1 tablet PO once. Repeat in 3 days if needed. 11/14/24   Gladis Elsie BROCKS, PA-C  lidocaine  (LIDODERM ) 5 % Place 1 patch onto the skin daily. Leave on affected area for 12 hours and remove for 12 hours 10/23/24   Mayer, Jodi R, NP  methocarbamol  (ROBAXIN ) 500 MG tablet Take 1 tablet (500 mg total) by mouth 2 (two) times daily as needed for muscle spasms. 10/23/24   Mayer, Jodi R, NP  moxifloxacin  (AVELOX ) 400 MG tablet Take 1 tablet (400 mg total) by mouth daily at 8 pm for 7 days. 11/10/24 11/17/24  Hermanns, Ashlee P, PA-C  naproxen  (NAPROSYN ) 500 MG tablet Take 1 tablet (500 mg total) by mouth 2 (two) times daily as needed. 10/23/24   Loreda Myla SAUNDERS, NP    Physical Exam: Vitals:   11/15/24 1836 11/15/24 2340 11/16/24 0000  BP: 128/78  107/67   Pulse: 67 76   Resp: 17 17   Temp: 98.8 F (37.1 C) 98.7 F (37.1 C)   TempSrc: Oral    SpO2: 100% 100%   Weight:   66.2 kg  Height:   5' 2 (1.575 m)     Constitutional: NAD, no pallor or diaphoresis   Eyes: PERTLA, lids and conjunctivae normal ENMT: Mucous membranes are moist. Posterior pharynx clear of any exudate or lesions.   Neck: supple, no masses  Respiratory: no wheezing, no crackles. No accessory muscle use.  Cardiovascular: S1 & S2 heard, regular rate and rhythm. No extremity edema.  Abdomen: No tenderness, soft. Bowel sounds active.  Musculoskeletal: no clubbing / cyanosis. No joint deformity upper and lower extremities.   Skin: no significant rashes, lesions, ulcers. Warm, dry, well-perfused. Neurologic: CN 2-12 grossly intact. Moving all extremities. Sleeping, wakes to voice and is fully oriented.  Psychiatric: Calm. Cooperative.    Labs and Imaging on Admission: I have personally reviewed following labs and imaging studies  CBC: Recent Labs  Lab 11/15/24 1841  WBC 11.8*  HGB 13.0  HCT 39.1  MCV 94.4  PLT 253   Basic Metabolic Panel: Recent Labs  Lab 11/15/24 1841 11/15/24 1949  NA 138  --   K 4.0  --   CL 104  --   CO2 27  --   GLUCOSE 83  --   BUN 9  --   CREATININE 0.84  --   CALCIUM 9.1  --   MG  --  1.7   GFR: Estimated Creatinine Clearance: 93.7 mL/min (by C-G formula based on SCr of 0.84 mg/dL). Liver Function Tests: Recent Labs  Lab 11/15/24 1841  AST 27  ALT 21  ALKPHOS 61  BILITOT 0.7  PROT 7.4  ALBUMIN 4.0   No results for input(s): LIPASE, AMYLASE in the last 168 hours. No results for input(s): AMMONIA in the last 168 hours. Coagulation Profile: No results for input(s): INR, PROTIME in the last 168 hours. Cardiac Enzymes: No results for input(s): CKTOTAL, CKMB, CKMBINDEX, TROPONINI in the last 168 hours. BNP (last 3 results) No results for input(s): PROBNP in the last 8760  hours. HbA1C: No results for input(s): HGBA1C in the last 72 hours. CBG: No results for input(s): GLUCAP in the last 168 hours. Lipid Profile: No results for input(s): CHOL, HDL, LDLCALC, TRIG, CHOLHDL, LDLDIRECT in the last 72 hours. Thyroid Function Tests: No results for input(s): TSH, T4TOTAL, FREET4, T3FREE, THYROIDAB in the last 72 hours. Anemia Panel: No results for input(s): VITAMINB12, FOLATE, FERRITIN, TIBC, IRON, RETICCTPCT in the last 72 hours. Urine analysis:    Component Value Date/Time   COLORURINE YELLOW 10/26/2023 1446   APPEARANCEUR HAZY (A) 10/26/2023 1446   LABSPEC 1.024 10/26/2023 1446   PHURINE 6.0 10/26/2023 1446   GLUCOSEU NEGATIVE 10/26/2023 1446   HGBUR SMALL (A) 10/26/2023 1446   BILIRUBINUR  negative 11/07/2024 1612   KETONESUR negative 11/07/2024 1612   KETONESUR NEGATIVE 10/26/2023 1446   PROTEINUR negative 10/05/2024 1622   PROTEINUR 30 (A) 10/26/2023 1446   UROBILINOGEN 0.2 11/07/2024 1612   UROBILINOGEN 0.2 02/04/2022 1218   NITRITE Negative 11/07/2024 1612   NITRITE NEGATIVE 10/26/2023 1446   LEUKOCYTESUR Small (1+) (A) 11/07/2024 1612   LEUKOCYTESUR LARGE (A) 10/26/2023 1446   Sepsis Labs: @LABRCNTIP (procalcitonin:4,lacticidven:4) )No results found for this or any previous visit (from the past 240 hours).   Radiological Exams on Admission: No results found.  EKG: Independently reviewed. Sinus rhythm.   Assessment/Plan   1. Intentional drug overdose  - Patient took what she believes is 6 doses of Percocet 10 mg ~5 pm in an attempt to end her life; she now expresses regret  - She is awake and fully oriented, has not required Narcan, 6-hour APAP level was undetectable, and there are no concerning features on EKG  - IVC paperwork filed by ED PA - Given uncertainty about what was ingested, Poison Control recommended 24 hr observation  - Continue suicide precautions, consult Psychiatry    2. Mycoplasma  genitalium infection  - Continue doxycyline followed by moxifloxacin      DVT prophylaxis: Lovenox   Code Status: Full  Level of Care: Level of care: Telemetry Family Communication: None present  Disposition Plan:  Patient is from: Home  Anticipated d/c is to: TBD Anticipated d/c date is: 12/10 or 11/17/24  Patient currently: Pending clinical stability, psychiatry consultation  Consults called: Routine Psychiatry consult requested via Epic order Admission status: Observation     Evalene GORMAN Sprinkles, MD [facility]  11/16/2024, 1:07 AM

## 2024-11-16 NOTE — Progress Notes (Signed)
 Pt has been accepted to [facility] on 11/16/2024 Bed assignment: 302-01  Pt meets inpatient criteria per: Patti Olden MD   Attending Physician will be: Pashayan MD  Report can be called to: Adult unit: [phone]  Pt can arrive after [facility] WILL UPDATE   Care Team Notified: [facility] Cherylynn Ernst RN, Lorane Right RN  [place] Aleria Maheu LCSW-A   11/16/2024 9:31 AM

## 2024-11-16 NOTE — Progress Notes (Signed)
 Patient refusing Doxycycline  due to taking one dose 7 days ago per patient that made her very itchy and reports she was told not to take it again. Patient still reports itching on arms from it today.

## 2024-11-16 NOTE — Progress Notes (Signed)
 Pt was admitted in paper scrubs calm and cooperative. Denies current SI/HI/paranoia/VH. She said that she does hear voices that say Stop the pain. She said it was emotional pain not physical pain. She said that she had a similar incident 3-4 months ago when she cut her L forearm where she has a scar. She said that the cause of her emotional pain is when she listens to other peoples' problems but they don't listen to hers and as such feels unsupported. She is hoping to learn to be patient, how to control her anger, and to get her Bipolar under control. She claims to currently have no strengths and is currently in cosmetology school. She currently lives with her mother whom she doesn't feel supported by emotionally. She is currently being treated for bacterial vaginitis and is on doxycycline  to which she is having a reaction to. She has rashes on her abdomen, back, L wrist, and L axillary.

## 2024-11-16 NOTE — Discharge Summary (Signed)
 Physician Discharge Summary  [name] FMW:[id number] DOB: [birth year] DOA: 11/15/2024  PCP: Patient, No Pcp Per  Admit date: 11/15/2024 Discharge date: 11/16/2024  Admitted From: Home Disposition: Inpatient psych  Recommendations for Outpatient Follow-up:  Follow up with PCP in 1-2 weeks Follow-up with primary psychiatry team as scheduled   Home Health: None Equipment/Devices: None  Discharge Condition: Stable CODE STATUS: Full Diet recommendation: Low-salt low-fat low-carb diet  Brief/Interim Summary: Samantha Long is a 23 y.o. female who denies any significant past medical history presents for evaluation after an intentional drug overdose with illicitly obtained Percocet.  Patient initially evaluated by poison control recommending 24-hour observation given need for close monitoring in the setting of unknown ingestion.  Patient is not certain she had taken Percocet or another medication.  She did take these medications with intent to harm herself.  Patient admitted as above with psychiatry consult.  At this time patient is recovering quite well, no further symptoms or signs of overdose or withdrawals.  Psychiatry recommending inpatient psych placement at discharge, she is at this time otherwise medically stable for discharge and agreeable to discharge to inpatient psych team.   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  UDS positive for opiates and THC  Mycoplasma genitalium infection  - Continue doxycyline through 11/17/2024, moxifloxacin  once daily for 7 days starting 12/12  Discharge  Instructions  Discharge Instructions     Call MD for:  difficulty breathing, headache or visual disturbances   Complete by: As directed    Call MD for:  extreme fatigue   Complete by: As directed    Call MD for:  hives   Complete by: As directed    Call MD for:  persistant dizziness or light-headedness   Complete by: As directed    Call MD for:  persistant nausea and vomiting   Complete by: As directed    Call MD for:  severe uncontrolled pain   Complete by: As directed    Call MD for:  temperature >100.4   Complete by: As directed    Diet - low sodium heart healthy   Complete by: As directed    Increase activity slowly   Complete by: As directed       Allergies as of 11/16/2024       Reactions   Amoxicillin Hives   Has patient had a PCN reaction causing immediate rash, facial/tongue/throat swelling, SOB or lightheadedness with hypotension: yes Has patient had a PCN reaction causing severe rash involving mucus membranes or skin necrosis: no Has patient had a PCN reaction that required hospitalization: ed visit Has patient had a PCN reaction occurring within the last 10 years: no If all of the above answers are NO, then may proceed with Cephalosporin use.   Nitrofuran Derivatives Itching, Swelling   Lip swelling and urticaria        Medication List     STOP taking these medications    metroNIDAZOLE  500 MG tablet Commonly known as: FLAGYL        TAKE these medications    doxycycline  100 MG tablet Commonly known as: VIBRA -TABS Take 1 tablet (100 mg total) by mouth 2 (two)  times daily for 7 days.   moxifloxacin  400 MG tablet Commonly known as: AVELOX  Take 1 tablet (400 mg total) by mouth daily at 8 pm. Start taking on: November 18, 2024        Allergies  Allergen Reactions   Amoxicillin Hives    Has patient had a PCN reaction causing immediate rash, facial/tongue/throat swelling, SOB or lightheadedness with hypotension: yes Has patient had a PCN  reaction causing severe rash involving mucus membranes or skin necrosis: no Has patient had a PCN reaction that required hospitalization: ed visit Has patient had a PCN reaction occurring within the last 10 years: no If all of the above answers are NO, then may proceed with Cephalosporin use.    Nitrofuran Derivatives Itching and Swelling    Lip swelling and urticaria    Consultations: Psychiatry  Procedures/Studies: No results found.   Subjective: No acute issues or events overnight   Discharge Exam: Vitals:   11/15/24 2340 11/16/24 0837  BP: 107/67 (!) 111/59  Pulse: 76 66  Resp: 17 16  Temp: 98.7 F (37.1 C) 98.4 F (36.9 C)  SpO2: 100% 100%   Vitals:   11/15/24 1836 11/15/24 2340 11/16/24 0000 11/16/24 0837  BP: 128/78 107/67  (!) 111/59  Pulse: 67 76  66  Resp: 17 17  16   Temp: 98.8 F (37.1 C) 98.7 F (37.1 C)  98.4 F (36.9 C)  TempSrc: Oral   Oral  SpO2: 100% 100%  100%  Weight:   66.2 kg   Height:   5' 2 (1.575 m)     General: Pt is alert, awake, not in acute distress Cardiovascular: RRR, S1/S2 +, no rubs, no gallops Respiratory: CTA bilaterally, no wheezing, no rhonchi Abdominal: Soft, NT, ND, bowel sounds + Extremities: no edema, no cyanosis    The results of significant diagnostics from this hospitalization (including imaging, microbiology, ancillary and laboratory) are listed below for reference.     Microbiology: No results found for this or any previous visit (from the past 240 hours).   Labs: BNP (last 3 results) No results for input(s): BNP in the last 8760 hours. Basic Metabolic Panel: Recent Labs  Lab 11/15/24 1841 11/15/24 1949 11/16/24 0600  NA 138  --  137  K 4.0  --  3.7  CL 104  --  105  CO2 27  --  25  GLUCOSE 83  --  73  BUN 9  --  7  CREATININE 0.84  --  0.83  CALCIUM 9.1  --  8.3*  MG  --  1.7  --    Liver Function Tests: Recent Labs  Lab 11/15/24 1841 11/16/24 0600  AST 27 24  ALT 21 17  ALKPHOS 61  50  BILITOT 0.7 0.8  PROT 7.4 5.8*  ALBUMIN 4.0 3.3*   No results for input(s): LIPASE, AMYLASE in the last 168 hours. No results for input(s): AMMONIA in the last 168 hours. CBC: Recent Labs  Lab 11/15/24 1841 11/16/24 0600  WBC 11.8* 8.4  HGB 13.0 10.9*  HCT 39.1 34.0*  MCV 94.4 95.0  PLT 253 205   Cardiac Enzymes: No results for input(s): CKTOTAL, CKMB, CKMBINDEX, TROPONINI in the last 168 hours. BNP: Invalid input(s): POCBNP CBG: No results for input(s): GLUCAP in the last 168 hours. D-Dimer No results for input(s): DDIMER in the last 72 hours. Hgb A1c No results for input(s): HGBA1C in the last 72 hours. Lipid Profile No results for input(s): CHOL, HDL, LDLCALC, TRIG,  CHOLHDL, LDLDIRECT in the last 72 hours. Thyroid function studies No results for input(s): TSH, T4TOTAL, T3FREE, THYROIDAB in the last 72 hours.  Invalid input(s): FREET3 Anemia work up No results for input(s): VITAMINB12, FOLATE, FERRITIN, TIBC, IRON, RETICCTPCT in the last 72 hours. Urinalysis    Component Value Date/Time   COLORURINE YELLOW 10/26/2023 1446   APPEARANCEUR HAZY (A) 10/26/2023 1446   LABSPEC 1.024 10/26/2023 1446   PHURINE 6.0 10/26/2023 1446   GLUCOSEU NEGATIVE 10/26/2023 1446   HGBUR SMALL (A) 10/26/2023 1446   BILIRUBINUR negative 11/07/2024 1612   KETONESUR negative 11/07/2024 1612   KETONESUR NEGATIVE 10/26/2023 1446   PROTEINUR negative 10/05/2024 1622   PROTEINUR 30 (A) 10/26/2023 1446   UROBILINOGEN 0.2 11/07/2024 1612   UROBILINOGEN 0.2 02/04/2022 1218   NITRITE Negative 11/07/2024 1612   NITRITE NEGATIVE 10/26/2023 1446   LEUKOCYTESUR Small (1+) (A) 11/07/2024 1612   LEUKOCYTESUR LARGE (A) 10/26/2023 1446   Sepsis Labs Recent Labs  Lab 11/15/24 1841 11/16/24 0600  WBC 11.8* 8.4   Microbiology No results found for this or any previous visit (from the past 240 hours).   Time coordinating discharge:  Over 30 minutes  SIGNED:   Elsie JAYSON Montclair, DO [facility] 11/16/2024, 10:33 AM Pager   If 7PM-7AM, please contact night-coverage [email or web address]

## 2024-11-16 NOTE — ED Notes (Signed)
 Pt refuses cardiac monitoring or vitals at this time.

## 2024-11-17 DIAGNOSIS — F329 Major depressive disorder, single episode, unspecified: Principal | ICD-10-CM

## 2024-11-17 LAB — LIPID PANEL
Cholesterol: 182 mg/dL (ref 0–200)
HDL: 52 mg/dL (ref >40)
LDL Cholesterol: 100 mg/dL — ABNORMAL HIGH (ref 0–99)
Total CHOL/HDL Ratio: 3.5 ratio
Triglycerides: 151 mg/dL — ABNORMAL HIGH (ref <150)
VLDL: 30 mg/dL (ref 0–40)

## 2024-11-17 LAB — HEMOGLOBIN A1C
Hgb A1c MFr Bld: 4.9 % (ref 4.8–5.6)
Mean Plasma Glucose: 93.93 mg/dL

## 2024-11-17 LAB — VITAMIN B12: Vitamin B-12: 798 pg/mL (ref 180–914)

## 2024-11-17 LAB — FOLATE: Folate: 11.8 ng/mL (ref >5.9)

## 2024-11-17 LAB — TSH: TSH: 0.41 u[IU]/mL (ref 0.350–4.500)

## 2024-11-17 MED ORDER — ARIPIPRAZOLE 2 MG PO TABS
2.0000 mg | ORAL_TABLET | Freq: Every day | ORAL | Status: DC
  Administered 2024-11-17 – 2024-11-18 (×2): 2 mg via ORAL
  Filled 2024-11-17 (×2): qty 1

## 2024-11-17 MED ORDER — METRONIDAZOLE 500 MG PO TABS
500.0000 mg | ORAL_TABLET | Freq: Two times a day (BID) | ORAL | Status: DC
  Administered 2024-11-17 – 2024-11-18 (×2): 500 mg via ORAL
  Filled 2024-11-17 (×2): qty 1

## 2024-11-17 MED ORDER — SERTRALINE HCL 50 MG PO TABS
50.0000 mg | ORAL_TABLET | Freq: Every day | ORAL | Status: DC
  Administered 2024-11-17 – 2024-11-18 (×2): 50 mg via ORAL
  Filled 2024-11-17 (×2): qty 1

## 2024-11-17 NOTE — Group Note (Signed)
 Date:  11/17/2024 Time:  10:11 AM  Group Topic/Focus: Nutrition Group   Pt did attend nutrition group  Samantha Long [name] 11/17/2024, 10:11 AM

## 2024-11-17 NOTE — Plan of Care (Signed)
  Problem: Education: Goal: Emotional status will improve Outcome: Progressing   Problem: Activity: Goal: Interest or engagement in activities will improve Outcome: Progressing   Problem: Coping: Goal: Ability to demonstrate self-control will improve Outcome: Progressing

## 2024-11-17 NOTE — Group Note (Signed)
 LCSW Group Therapy Note   Group Date: 11/17/2024 Start Time: 1100 End Time: 1200   Participation:  patient was present for 25% of the group session.  She listened and was respectful but didn't participate in the discussion.    Type of Therapy:  Group Therapy  Topic: Healing Flames: Navigating Anger with Compassion  Objective:  Foster self-awareness and promote compassion toward oneself and others when dealing with anger.  Goals:  Help participants understand the underlying emotions and needs fueling anger. Provide coping strategies for healthier emotional expression and anger management.  Summary: This session explored anger as a volcano--an explosion driven by deeper feelings and unmet needs. Participants learned to identify anger triggers and underlying emotions, then practiced coping strategies like deep breathing, physical activity, and journaling. The group discussed healthy ways to manage anger before it escalates, using both personal reflection and shared experiences.  Therapeutic Modalities: Cognitive Behavioral Therapy (CBT): Challenging thoughts that fuel anger. Mindfulness: Increasing awareness of emotions and sensations.   Samantha Long, LCSWA 11/17/2024  12:09 PM
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
## 2024-11-17 NOTE — Progress Notes (Addendum)
(  Sleep Hours) -7.5  (Any PRNs that were needed, meds refused, or side effects to meds)- Trazodone  50 mg  (Any disturbances and when (visitation, over night)-none  (Concerns raised by the patient)- wants to discuss med doxycycline ; it makes her itch and wants to switch medication   (SI/HI/AVH)- denies

## 2024-11-17 NOTE — [facility] Group Notes (Signed)
 Samantha Long did not attend wrap up group

## 2024-11-17 NOTE — Progress Notes (Signed)
(  Sleep Hours) -7  (Any PRNs that were needed, meds refused, or side effects to meds)- none  (Any disturbances and when (visitation, over night)- none  (Concerns raised by the patient)- none  (SI/HI/AVH)- denies

## 2024-11-17 NOTE — Group Note (Signed)
 Date:  11/17/2024 Time:  9:39 AM  Group Topic/Focus:  Goals Group:   The focus of this group is to help patients establish daily goals to achieve during treatment and discuss how the patient can incorporate goal setting into their daily lives to aide in recovery. Orientation:   The focus of this group is to educate the patient on the purpose and policies of crisis stabilization and provide a format to answer questions about their admission.  The group details unit policies and expectations of patients while admitted.    Participation Level:  Active  Participation Quality:  Attentive  Affect:  Appropriate  Cognitive:  Appropriate  Insight: Appropriate  Engagement in Group:  Engaged  Modes of Intervention:  Activity  Additional Comments:  N/A  Samantha Long 11/17/2024, 9:39 AM

## 2024-11-17 NOTE — [facility] Suicide Risk Assessment (Signed)
 [facility] INPATIENT:  Family/Significant Other Suicide Prevention Education  Suicide Prevention Education:  Education Completed; Mother, Brittan Butterbaugh [phone],  (name of family member/significant other) has been identified by the patient as the family member/significant other with whom the patient will be residing, and identified as the person(s) who will aid the patient in the event of a mental health crisis (suicidal ideations/suicide attempt).  With written consent from the patient, the family member/significant other has been provided the following suicide prevention education, prior to the and/or following the discharge of the patient.  Mother states pt has never been suicidal, believes that pt was trying to run away from a problem rather than face it head on. Pt lives at home with mother and siblings, no safety concerns with patient returning home.  Mother would like patient to be discharged, afraid pt is going to be dropped from school which is a huge concern for both mother and patient.   No access to weapons or firearms. Aware of 10 AM pick up time on day of discharge, will need to be notified once discharge date is set.   The suicide prevention education provided includes the following: Suicide risk factors Suicide prevention and interventions National Suicide Hotline telephone number [facility] assessment telephone number [facility] Emergency Assistance 911 [facility] and/or Residential Mobile Crisis Unit telephone number  Request made of family/significant other to: Remove weapons (e.g., guns, rifles, knives), all items previously/currently identified as safety concern.   Remove drugs/medications (over-the-counter, prescriptions, illicit drugs), all items previously/currently identified as a safety concern.  The family member/significant other verbalizes understanding of the suicide prevention education information provided.  The family member/significant other  agrees to remove the items of safety concern listed above.  Jenkins LULLA Primer 11/17/2024, 12:54 PM

## 2024-11-17 NOTE — Plan of Care (Signed)
   Problem: Education: Goal: Emotional status will improve Outcome: Progressing Goal: Mental status will improve Outcome: Progressing Goal: Verbalization of understanding the information provided will improve Outcome: Progressing

## 2024-11-17 NOTE — Group Note (Signed)
 Date:  11/17/2024 Time:  9:14 PM  Group Topic/Focus:  Self Care:   The focus of this group is to help patients understand the importance of self-care in order to improve or restore emotional, physical, spiritual, interpersonal, and financial health.    Pt did not attend group.  Monque Haggar L 11/17/2024, 9:14 PM

## 2024-11-17 NOTE — Group Note (Signed)
 Date:  11/17/2024 Time:  11:50 AM  Group Topic/Focus: Social Work Group    Pt did attend social work group  Shaquasha Gerstel R Mycal Conde 11/17/2024, 11:50 AM

## 2024-11-17 NOTE — Group Note (Signed)
 Date:  11/17/2024 Time:  3:34 PM  Group Topic/Focus: Occupational Therapy     Pt did attend occupational therapy group  Bilbo Carcamo R Jhair Witherington 11/17/2024, 3:34 PM

## 2024-11-17 NOTE — H&P (Signed)
 Psychiatric Admission Assessment Adult  Patient Identification: Samantha Long  MRN:  [id number]  Date of Evaluation:  11/17/2024  Chief Complaint:  Suicide attempt [facility]) [T14.91XA]  Principal Diagnosis: Major depressive disorder, single episode  Diagnosis:  Principal Problem:   Major depressive disorder, single episode Active Problems:   Suicide attempt (HCC)  History of Present Illness: This is the first psychiatric admission in this [facility] for this 23 year old AA female. Patient has no known history of mental health issues or treatments. May have substance abuse problems. Admitted to the [facility] from the [facility] hospital with complaints of intentional suicide attempt by overdose on 6 percocet tablets. After ingesting the 6 percocet tablets, patient was taken to the [facility] ED where she was evaluated, treated, medically cleared & was recommended for inpatient psychiatric admission. Patient's current lab results reviewed. Her UDS was positive for opioid drug & THC. She is currently receiving antibiotic therapy for bacterial vaginitis. During this evaluation, patient reports,   It was this past Tuesday that I got to feeling horrible because I was over stimulated because of all the things happening around me. That led me to breakdown. I wanted to not feel that much pain, I decided to numb it by taking 6 percocet tablets I bought off the streets. After I took the pills, I called an uber & I was taken to the hospital for treatment. I have been dealing with other peoples' problems but does not have time to deal with mine. All I find myself doing is listening to other people complain. Then when I feel like I have been played or lied to, I will yell, scream, mess-up stuff or punch stuff. This kind of mood or behavior has been with me since I was 23 years old. 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 He was driving my  car then. I had just spent three thousand dollars on that car. When I got there, I realized that the reason he was pulled over was because he was not wearing a seatbelt. From there, the cop noticed that  my 30-day plate was not where it should have been. I had to leave school that day to go meet my boyfriend. Not only, my car got towed. I tried to call around for help, but no one could help me. Then, my family stepped in & helped me eventually. By the time I got the help I needed, I was already feeling frustrated. That was when I took the pills to help me calm down. I was not trying to kill myself. I know at the ED, I was told that I was saying that I was feeling suicidal when I took those percocet pills. I was not. All I know now is, I was so high when I got to the ED that it is possible I did not even comprehend the questions or what were going on around me. I would like to take medicines if it will help me. I do not sleep at all at night. Patient currently denies any SIHI, AVH, delusional thoughts or paranoia. He does not appear to be responding to any internal stimuli.  Associated Signs/Symptoms:  Depression Symptoms:  depressed mood, insomnia,  (Hypo) Manic Symptoms:  Impulsivity, Labiality of Mood,  Anxiety Symptoms:  Excessive Worry,  Psychotic Symptoms:  Patient currently denies any AVH, delusional thoughts or paranoia.  PTSD Symptoms: NA  Total Time spent with patient: 1.5 hours  Past Psychiatric History: Patient current denies any denies any hx of mental health issues. She denies any mental health treatments or hospitalizations.  Is the patient at risk to self? No.  Has the patient been a risk to self in the past 6 months? Yes.    Has the patient been a risk to self within the distant past? No.  Is the patient a risk to others? No.  Has the patient been a risk to others in the past 6 months? No.  Has the patient been a risk to others within the distant past? No.   [place]  Scale:  Flowsheet Row [facility] (Current[facility] Most recent reading at 11/16/2024  8:00 PM ED from 11/15/2024 in [facility] Emergency Department at [facility] Most recent reading at 11/15/2024  6:41 PM ED from 11/15/2024 in [facility] Most recent reading at 11/15/2024  5:54 PM  C-SSRS RISK CATEGORY Moderate Risk High Risk High Risk   Prior Inpatient Therapy: No. If yes, describe: NA  Prior Outpatient Therapy: No. If yes, describe: NA   Alcohol Screening: Patient refused Alcohol Screening Tool:  (no) 1. How often do you have a drink containing alcohol?: Never 2. How many drinks containing alcohol do you have on a typical day when you are drinking?: 1 or 2 3. How often do you have six or more drinks on one occasion?: Never AUDIT-C Score: 0  Substance Abuse History in the last 12 months:  Yes.    Consequences of Substance Abuse: Discussed with patient during this admission evaluation. Medical Consequences:  Liver damage, Possible death by overdose Legal Consequences:  Arrests, jail time, Loss of driving privilege. Family Consequences:  Family discord, divorce and or separation.  Previous Psychotropic Medications: No   Psychological Evaluations: Yes   Past Medical History:  Past Medical History:  Diagnosis Date   Asthma    Bacterial vaginitis 11/16/2024   Day 7 of treatment   Chlamydia 09/24/2018   Gonorrhea 09/24/2018   Medical history non-contributory    Trichimoniasis 09/24/2018   No past surgical history on file. Family History:  Family History  Problem Relation Age of Onset   Healthy Mother    Healthy Father    Family Psychiatric  History: Patient reports mother has bipolar & anxiety disorders. Reports that maternal aunt suffers from bipolar disorder. Says maternal grandmother did attempt suicide when she was alive. She survived it.  Tobacco Screening: Tobacco Use History[1]   BH Tobacco Counseling     Are you interested in Tobacco Cessation Medications?  No value filed. Counseled patient on smoking cessation:  No value filed. Reason Tobacco Screening Not Completed: No value filed.       Social History: Single, has no children, currently in a cosmetology school. Lives in [place], [place].  Social History   Substance and Sexual Activity  Alcohol Use No     Social History   Substance and Sexual Activity  Drug Use Yes   Types: Marijuana    Additional Social History: Marital status: Single Are you sexually active?: No What is your sexual orientation?: Bisexual Has your sexual activity been affected by drugs, alcohol, medication, or emotional stress?: No Does patient have children?: No  Allergies:  Allergies[2] Lab Results:  Results for orders placed or performed during the hospital encounter of 11/16/24 (from the past 48 hours)  Folate     Status: None  Collection Time: 11/17/24  6:42 AM  Result Value Ref Range   Folate 11.8 >5.9 ng/mL    Comment: Performed at [facility], [address], [place], [place] [address]  Hemoglobin A1c     Status: None   Collection Time: 11/17/24  6:42 AM  Result Value Ref Range   Hgb A1c MFr Bld 4.9 4.8 - 5.6 %    Comment: (NOTE) Diagnosis of Diabetes The following HbA1c ranges recommended by the American Diabetes Association (ADA) may be used as an aid in the diagnosis of diabetes mellitus.  Hemoglobin             Suggested A1C NGSP%              Diagnosis  <5.7                   Non Diabetic  5.7-6.4                Pre-Diabetic  >6.4                   Diabetic  <7.0                   Glycemic control for                       adults with diabetes.     Mean Plasma Glucose 93.93 mg/dL    Comment: Performed at [facility], [address], [place], [place] [address]  Lipid panel     Status: Abnormal   Collection Time: 11/17/24  6:42 AM  Result Value Ref Range   Cholesterol  182 0 - 200 mg/dL    Comment:        ATP III CLASSIFICATION:  <200     mg/dL   Desirable  799-760  mg/dL   Borderline High  >=759    mg/dL   High           Triglycerides 151 (H) <150 mg/dL   HDL 52 >59 mg/dL   Total CHOL/HDL Ratio 3.5 RATIO   VLDL 30 0 - 40 mg/dL   LDL Cholesterol 899 (H) 0 - 99 mg/dL    Comment:        Total Cholesterol/HDL:CHD Risk Coronary Heart Disease Risk Table                     Men   Women  1/2 Average Risk   3.4   3.3  Average Risk       5.0   4.4  2 X Average Risk   9.6   7.1  3 X Average Risk  23.4   11.0        Use the calculated Patient Ratio above and the CHD Risk Table to determine the patient's CHD Risk.        ATP III CLASSIFICATION (LDL):  <100     mg/dL   Optimal  899-870  mg/dL   Near or Above                    Optimal  130-159  mg/dL   Borderline  839-810  mg/dL   High  >809     mg/dL   Very High Performed at [facility], [address] [place], [place] [address]   TSH     Status: None   Collection Time: 11/17/24  6:42 AM  Result  Value Ref Range   TSH 0.410 0.350 - 4.500 uIU/mL    Comment: Performed at [facility], [address], [place], [place] [address]  Vitamin B12     Status: None   Collection Time: 11/17/24  6:42 AM  Result Value Ref Range   Vitamin B-12 798 180 - 914 pg/mL    Comment: Performed at [facility], [address] [place], [place] [address]    Blood Alcohol level:  Lab Results  Component Value Date   [facility] <15 11/15/2024   Metabolic Disorder Labs:  Lab Results  Component Value Date   HGBA1C 4.9 11/17/2024   MPG 93.93 11/17/2024   MPG 108 11/02/2018   Lab Results  Component Value Date   PROLACTIN 15.9 11/02/2018   Lab Results  Component Value Date   CHOL 182 11/17/2024   TRIG 151 (H) 11/17/2024   HDL 52 11/17/2024   CHOLHDL 3.5 11/17/2024   VLDL 30 11/17/2024   LDLCALC 100 (H) 11/17/2024   LDLCALC 109 11/02/2018   Current  Medications: Current Facility-Administered Medications  Medication Dose Route Frequency Provider Last Rate Last Admin   acetaminophen  (TYLENOL ) tablet 650 mg  650 mg Oral Q6H PRN Lenard Calin, MD       alum & mag hydroxide-simeth (MAALOX/MYLANTA) 200-200-20 MG/5ML suspension 30 mL  30 mL Oral Q4H PRN Lenard Calin, MD       ARIPiprazole (ABILIFY) tablet 2 mg  2 mg Oral Daily Kryssa Risenhoover I, NP       haloperidol (HALDOL) tablet 5 mg  5 mg Oral TID PRN Lenard Calin, MD       And   diphenhydrAMINE  (BENADRYL ) capsule 50 mg  50 mg Oral TID PRN Lenard Calin, MD       haloperidol lactate (HALDOL) injection 5 mg  5 mg Intramuscular TID PRN Lenard Calin, MD       And   diphenhydrAMINE  (BENADRYL ) injection 50 mg  50 mg Intramuscular TID PRN Lenard Calin, MD       And   LORazepam  (ATIVAN ) injection 2 mg  2 mg Intramuscular TID PRN Lenard Calin, MD       haloperidol lactate (HALDOL) injection 10 mg  10 mg Intramuscular TID PRN Lenard Calin, MD       And   diphenhydrAMINE  (BENADRYL ) injection 50 mg  50 mg Intramuscular TID PRN Lenard Calin, MD       And   LORazepam  (ATIVAN ) injection 2 mg  2 mg Intramuscular TID PRN Lenard Calin, MD       doxycycline  (VIBRA -TABS) tablet 100 mg  100 mg Oral BID Lenard Calin, MD       hydrOXYzine  (ATARAX ) tablet 25 mg  25 mg Oral TID PRN Lenard Calin, MD   25 mg at 11/16/24 1854   magnesium hydroxide (MILK OF MAGNESIA) suspension 30 mL  30 mL Oral Daily PRN Lenard Calin, MD       sertraline (ZOLOFT) tablet 50 mg  50 mg Oral Daily Rory Xiang, Mac I, NP       traZODone  (DESYREL ) tablet 50 mg  50 mg Oral QHS PRN Lenard Calin, MD   50 mg at 11/16/24 2059   PTA Medications: Medications Prior to Admission  Medication Sig Dispense Refill Last Dose/Taking   doxycycline  (VIBRA -TABS) 100 MG tablet Take 1 tablet (100 mg total) by mouth 2 (two) times daily for 7 days. 14 tablet 0    [START ON 11/18/2024] moxifloxacin  (AVELOX ) 400 MG tablet  Take  1 tablet (400 mg total) by mouth daily at 8 pm. 7 tablet 0    AIMS:  ,  ,  ,  ,  ,  ,    Musculoskeletal: Strength & Muscle Tone: within normal limits Gait & Station: normal Patient leans: N/A  Psychiatric Specialty Exam:  Presentation  General Appearance:  Casual; Fairly Groomed  Eye Contact: Fair  Speech: Clear and Coherent; Normal Rate  Speech Volume: Normal  Handedness:Right   Mood and Affect  Mood: Depressed  Affect: Congruent; Flat; Tearful   Thought Process  Thought Processes: Coherent; Goal Directed; Linear  Duration of Psychotic Symptoms:N/A  Past Diagnosis of Schizophrenia or Psychoactive disorder: NA  Descriptions of Associations:Intact  Orientation:Full (Time, Place and Person)  Thought Content:Logical  Hallucinations:Hallucinations: None  Ideas of Reference:None  Suicidal Thoughts:Suicidal Thoughts: No  Homicidal Thoughts:Homicidal Thoughts: No   Sensorium  Memory: Immediate Good; Recent Good; Remote Good  Judgment: Fair  Insight: Fair   Art Therapist  Concentration: Good  Attention Span: Good  Recall: Good  Fund of Knowledge: Fair  Language: Good   Psychomotor Activity  Psychomotor Activity: Psychomotor Activity: Normal   Assets  Assets: Communication Skills; Desire for Improvement; Housing; Physical Health; Resilience; Social Support   Sleep  Sleep: Sleep: Poor  Estimated Sleeping Duration (Last 24 Hours): 3.75-5.00 hours   Physical Exam: Physical Exam Vitals and nursing note reviewed.  HENT:     Head: Normocephalic.     Nose: Nose normal.     Mouth/Throat:     Pharynx: Oropharynx is clear.  Cardiovascular:     Rate and Rhythm: Normal rate.     Pulses: Normal pulses.  Pulmonary:     Effort: Pulmonary effort is normal.  Genitourinary:    Comments: Deferred. Musculoskeletal:        General: Normal range of motion.     Cervical back: Normal range of motion.  Skin:     General: Skin is warm and dry.  Neurological:     General: No focal deficit present.     Mental Status: She is alert and oriented to person, place, and time.    Review of Systems  Constitutional:  Negative for chills, diaphoresis, fever and malaise/fatigue.  HENT:  Negative for congestion and sore throat (UDS (+) for opiate & THC.).   Eyes:  Negative for blurred vision.  Respiratory:  Negative for cough, shortness of breath and wheezing.   Cardiovascular:  Negative for chest pain and palpitations.  Gastrointestinal:  Negative for abdominal pain, constipation, diarrhea, heartburn, nausea and vomiting.  Genitourinary:  Negative for dysuria.  Musculoskeletal:  Negative for joint pain and myalgias.  Skin:  Negative for itching and rash.  Neurological:  Negative for dizziness, tingling, tremors, sensory change, speech change, focal weakness, seizures, loss of consciousness, weakness and headaches.  Endo/Heme/Allergies:        Allergies: Amoxil, Doxycycline , Nitrofuran derivatives.  Psychiatric/Behavioral:  Positive for depression, substance abuse and suicidal ideas. Negative for hallucinations and memory loss. The patient is nervous/anxious and has insomnia.    Blood pressure 132/72, pulse 93, temperature 98.2 F (36.8 C), temperature source Oral, resp. rate 16, height 5' 2 (1.575 m), weight 62.7 kg, last menstrual period 10/29/2024, SpO2 100%. Body mass index is 25.28 kg/m.  Treatment Plan Summary: Daily contact with patient to assess and evaluate symptoms and progress in treatment and Medication management.   Principal/active diagnoses.  Major depressive disorder, single episode, severe.   Medical.  STD (Bacterial vaginitis).  Plan: The risks/benefits/side-effects/alternatives  to the medications in use were discussed in detail with the patient and time was given for patient's questions. The patient consents to medication trial.   -Initiated Sertraline 50 mg po daily for depression.   -Initiated Abilify 2 mg po daily for antidepressant augmentation. -Continue Hydroxyzine  25 mg po tid prn for anxiety.  -Continue Trazodone  50 mg po Q hs prn for anxiety.   Agitation protocols.  -Continue as recommended (see MAR).  Medical.  -Discontinued Doxycycline  100 mg po bid for infection.  -Initiated Flagyl  500 mg po bid x 7 days.  Other PRNS -Continue Tylenol  650 mg every 6 hours PRN for mild pain -Continue Maalox 30 ml Q 4 hrs PRN for indigestion -Continue MOM 30 ml po Q 6 hrs for constipation  Safety and Monitoring: Voluntary admission to inpatient psychiatric unit for safety, stabilization and treatment Daily contact with patient to assess and evaluate symptoms and progress in treatment Patient's case to be discussed in multi-disciplinary team meeting Observation Level : q15 minute checks Vital signs: q12 hours Precautions: Safety  Discharge Planning: Social work and case management to assist with discharge planning and identification of hospital follow-up needs prior to discharge Estimated LOS: 5-7 days Discharge Concerns: Need to establish a safety plan; Medication compliance and effectiveness Discharge Goals: Return home with outpatient referrals for mental health follow-up including medication management/psychotherapy  Observation Level/Precautions:  15 minute checks  Laboratory:  Per ED. Current lab results reviewed.  Psychotherapy: Enrolled in the group sessions.  Medications: See [facility] for list of meds.  Consultations: As needed.  Discharge Concerns: Safety, mood stability.  Estimated LOS: 3-5 days.  Other: NA   Physician Treatment Plan for Primary Diagnosis: Major depressive disorder, single episode Long Term Goal(s): Improvement in symptoms so as ready for discharge  Short Term Goals: Ability to identify changes in lifestyle to reduce recurrence of condition will improve, Ability to verbalize feelings will improve, Ability to disclose and discuss suicidal  ideas, and Ability to demonstrate self-control will improve  Physician Treatment Plan for Secondary Diagnosis: Principal Problem:   Major depressive disorder, single episode Active Problems:   Suicide attempt [facility])  Long Term Goal(s): Improvement in symptoms so as ready for discharge  Short Term Goals: Ability to identify and develop effective coping behaviors will improve, Ability to maintain clinical measurements within normal limits will improve, Compliance with prescribed medications will improve, and Ability to identify triggers associated with substance abuse/mental health issues will improve  I certify that inpatient services furnished can reasonably be expected to improve the patient's condition.    Mac Bolster, NP, pmhnp, fnp-bc. 12/11/20251:34 PM       [1]  Social History Tobacco Use  Smoking Status Never   Passive exposure: Yes  Smokeless Tobacco Never  [2]  Allergies Allergen Reactions   Doxycycline  Rash    Rashes on abdomen, back, L wrist, and L axillary on 7th day of treatment.   Amoxicillin Hives    Has patient had a PCN reaction causing immediate rash, facial/tongue/throat swelling, SOB or lightheadedness with hypotension: yes Has patient had a PCN reaction causing severe rash involving mucus membranes or skin necrosis: no Has patient had a PCN reaction that required hospitalization: ed visit Has patient had a PCN reaction occurring within the last 10 years: no If all of the above answers are NO, then may proceed with Cephalosporin use.    Nitrofuran Derivatives Itching and Swelling    Lip swelling and urticaria

## 2024-11-17 NOTE — Group Note (Signed)
 Date:  11/17/2024 Time:  2:16 PM  Group Topic/Focus: Recreational Therapy- Drumming Group    Pt did attend recreational therapy drumming group.   Antawn Sison R Dyanara Cozza 11/17/2024, 2:16 PM

## 2024-11-17 NOTE — Progress Notes (Signed)
°   11/17/24 1800  Psych Admission Type (Psych Patients Only)  Admission Status Involuntary  Psychosocial Assessment  Patient Complaints Anxiety  Eye Contact Fair  Facial Expression Animated  Affect Appropriate to circumstance  Speech Logical/coherent  Interaction Assertive  Motor Activity Other (Comment)  Appearance/Hygiene Unremarkable  Behavior Characteristics Cooperative  Mood Pleasant  Thought Process  Coherency WDL  Content WDL  Delusions None reported or observed  Perception WDL  Hallucination None reported or observed  Judgment Limited  Confusion None  Danger to Self  Current suicidal ideation? Denies  Danger to Others  Danger to Others None reported or observed   Pt denies SI/HI/AVH.

## 2024-11-17 NOTE — Plan of Care (Signed)

## 2024-11-17 NOTE — Group Note (Signed)
 Recreation Therapy Group Note   Group Topic:Other  Group Date: 11/17/2024 Start Time: 1305 End Time: 1350 Facilitators: Keng Jewel-McCall, LRT,CTRS Location: [address] Dayroom   Activity Description/Intervention: Therapeutic Drumming. Patients with peers and staff were given the opportunity to engage in a leader facilitated [facility] Empowerment Drumming Circle with staff from the [employer], in partnership with [employer]. Teaching laboratory technician and trained [employer], Norleen Mon leading with LRT observing and documenting intervention and pt response. This evidenced-based practice targets 7 areas of health and wellbeing in the human experience including: stress-reduction, exercise, self-expression, camaraderie/support, nurturing, spirituality, and music-making (leisure).   Goal Area(s) Addresses:  Patient will engage in pro-social way in music group.  Patient will follow directions of drum leader on the first prompt. Patient will demonstrate no behavioral issues during group.  Patient will identify if a reduction in stress level occurs as a result of participation in therapeutic drum circle.    Education: Leisure exposure, Pharmacologist, Musical expression, Discharge Planning   Affect/Mood: Appropriate   Participation Level: Engaged   Participation Quality: Independent   Behavior: Appropriate   Speech/Thought Process: Focused   Insight: Good   Judgement: Good   Modes of Intervention: Teaching Laboratory Technician   Patient Response to Interventions:  Engaged   Education Outcome:  In group clarification offered    Clinical Observations/Individualized Feedback: Samantha Long actively engaged in therapeutic drumming exercise and discussions. Pt was appropriate with peers, staff, and musical equipment for duration of programming.  Pt identified peaceful as their feeling after participation in music-based programming. Pt affect congruent with verbalized  emotion.    Plan: Continue to engage patient in RT group sessions 2-3x/week.   Samantha Long, LRT,CTRS 11/17/2024 3:43 PM

## 2024-11-18 DIAGNOSIS — F321 Major depressive disorder, single episode, moderate: Secondary | ICD-10-CM

## 2024-11-18 DIAGNOSIS — T1491XA Suicide attempt, initial encounter: Secondary | ICD-10-CM

## 2024-11-18 LAB — MISC LABCORP TEST (SEND OUT): Labcorp test code: 81950

## 2024-11-18 MED ORDER — SERTRALINE HCL 50 MG PO TABS: 50.0000 mg | ORAL_TABLET | Freq: Every day | ORAL | 0 refills | Status: AC

## 2024-11-18 MED ORDER — MOXIFLOXACIN HCL 400 MG PO TABS: 400.0000 mg | ORAL_TABLET | Freq: Every day | ORAL | 0 refills | Status: AC

## 2024-11-18 MED ORDER — HYDROXYZINE HCL 25 MG PO TABS: 25.0000 mg | ORAL_TABLET | Freq: Three times a day (TID) | ORAL | 0 refills | Status: AC | PRN

## 2024-11-18 MED ORDER — ARIPIPRAZOLE 2 MG PO TABS: 2.0000 mg | ORAL_TABLET | Freq: Every day | ORAL | 0 refills | Status: AC

## 2024-11-18 MED ORDER — TRAZODONE HCL 50 MG PO TABS: 50.0000 mg | ORAL_TABLET | Freq: Every evening | ORAL | 0 refills | Status: AC | PRN

## 2024-11-18 MED ORDER — METRONIDAZOLE 500 MG PO TABS: 500.0000 mg | ORAL_TABLET | Freq: Two times a day (BID) | ORAL | 0 refills | Status: AC

## 2024-11-18 NOTE — Discharge Summary (Signed)
 Physician Discharge Summary Note  Patient:  Samantha Long is an 23 y.o., female  MRN:  [id number]  DOB:  Aug 04, 2001  Patient phone:  [phone] (home)   Patient address:   [address] [place] [address],   Total Time spent with patient: 45 minutes  Date of Admission:  11/16/2024  Date of Discharge: 11-18-24  Reason for Admission: Complaints of intentional suicide attempt by overdose on 6 percocet tablets.   Principal Problem: Major depressive disorder, single episode  Discharge Diagnoses: Principal Problem:   Major depressive disorder, single episode Active Problems:   Suicide attempt [facility])  Past Psychiatric History: See H&P.  Past Medical History:  Past Medical History:  Diagnosis Date   Asthma    Bacterial vaginitis 11/16/2024   Day 7 of treatment   Chlamydia 09/24/2018   Gonorrhea 09/24/2018   Medical history non-contributory    Trichimoniasis 09/24/2018   No past surgical history on file.  Family History:  Family History  Problem Relation Age of Onset   Healthy Mother    Healthy Father    Family Psychiatric  History: See H&P.  Social History:  Social History   Substance and Sexual Activity  Alcohol Use No     Social History   Substance and Sexual Activity  Drug Use Yes   Types: Marijuana    Social History   Socioeconomic History   Marital status: Single    Spouse name: Not on file   Number of children: Not on file   Years of education: Not on file   Highest education level: Not on file  Occupational History   Not on file  Tobacco Use   Smoking status: Never    Passive exposure: Yes   Smokeless tobacco: Never  Vaping Use   Vaping status: Former  Substance and Sexual Activity   Alcohol use: No   Drug use: Yes    Types: Marijuana   Sexual activity: Not Currently    Birth control/protection: None  Other Topics Concern   Not on file  Social History Narrative   Not on file   Social Drivers of Health    Tobacco Use: Medium Risk (11/15/2024)   Patient History    Smoking Tobacco Use: Never    Smokeless Tobacco Use: Never    Passive Exposure: Yes  Financial Resource Strain: Not on file  Food Insecurity: No Food Insecurity (11/16/2024)   Epic    Worried About Programme Researcher, Broadcasting/film/video in the Last Year: Never true    Ran Out of Food in the Last Year: Never true  Transportation Needs: Unmet Transportation Needs (11/16/2024)   Epic    Lack of Transportation (Medical): Yes    Lack of Transportation (Non-Medical): Yes  Physical Activity: Not on file  Stress: Not on file  Social Connections: Not on file  Depression (PHQ2-9): Not on file  Alcohol Screen: Low Risk (11/16/2024)   Alcohol Screen    Last Alcohol Screening Score (AUDIT): 0  Housing: Low Risk (11/16/2024)   Epic    Unable to Pay for Housing in the Last Year: No    Number of Times Moved in the Last Year: 0    Homeless in the Last Year: No  Utilities: Not At Risk (11/16/2024)   Epic    Threatened with loss of utilities: No  Health Literacy: Not on file   Hospital Course: (Per admission evaluation notes): 23 year old AA female. Patient has no known history of mental health issues  or treatments. May have substance abuse problems. Admitted to the [facility] from the [facility] hospital with complaints of intentional suicide attempt by overdose on 6 percocet tablets. After ingesting the 6 percocet tablets, patient was taken to the [facility] ED where she was evaluated, treated, medically cleared & was recommended for inpatient psychiatric admission. Patient's current lab results reviewed. Her UDS was positive for opioid drug & THC. She is currently receiving antibiotic therapy for bacterial vaginitis.   After the above admission evaluation, Elita was recommended for mood stabilization treatments. The medication regimen targeting her presenting symptoms were discussed with her & with consent initiated. However, later in the day, patient  reported to the staff that she is worried about getting kicked out of school if she misses one more day of school. She did state that she was never suicidal when she took the 6 tablets of Percocet that made her come to the hospital. She stated that she took them to calm herself down when she found her feeling overwhelmed. She stated that the claim she made while at the ED that she was trying to kill herself when she took those percocet tabs were made when she was high on the effect of those medicines. Reports from staff indicated that patient's mother supported patient's statements that her daughter was never suicidal. Patient's mother also indicated that patient lives with her & that patient will need to get back to school or risk getting dropped out.   Tiffanie today denies any SIHI, AVH, delusional thoughts or paranoia. She does not appear to be responding to any internal stimuli. She denies any symptoms of depression or anxiety. She denies any other issues or concerns. At this time, there are no clinical criteria to keep this patient admitted to the hospital.   Although this is a brief hospital stay for [facility], she seems stable to be discharged to her home with mother. She does have her support system within her family circle. She is being discharged with strict instruction to call 911, [phone], go to the nearest ED or come back to the [facility] or the behavioral urgent care for further evaluation/treatments if her symptoms worsens. Patient agrees. Patient left [facility] with all personal belongings in no apparent distress. She is instructed to assure to complete her antibiotic therapy as recommended.  Physical Findings: AIMS:  , ,  ,  ,  ,  ,   CIWA:    COWS:     Musculoskeletal: Strength & Muscle Tone: within normal limits Gait & Station: normal Patient leans: N/A   Psychiatric Specialty Exam:  Presentation  General Appearance:  Appropriate for Environment; Casual; Fairly Groomed  Eye  Contact: Good  Speech: Clear and Coherent; Normal Rate  Speech Volume: Normal  Handedness: Right  Mood and Affect  Mood: Euthymic  Affect: Appropriate; Congruent  Thought Process  Thought Processes: Coherent; Goal Directed; Linear  Descriptions of Associations:Intact  Orientation:Full (Time, Place and Person)  Thought Content:Logical  History of Schizophrenia/Schizoaffective disorder: NA  Duration of Psychotic Symptoms: NA  Hallucinations:Hallucinations: None  Ideas of Reference:None  Suicidal Thoughts:Suicidal Thoughts: No  Homicidal Thoughts:Homicidal Thoughts: No   Sensorium  Memory: Immediate Good; Recent Good; Remote Good  Judgment: Fair  Insight: Fair  Art Therapist  Concentration: Good  Attention Span: Good  Recall: Good  Fund of Knowledge: Fair  Language: Good   Psychomotor Activity  Psychomotor Activity: Psychomotor Activity: Normal   Assets  Assets: Communication Skills; Desire for Improvement; Financial Resources/Insurance; Housing; Physical  Health; Resilience; Social Support   Sleep  Sleep: Sleep: Good  Estimated Sleeping Duration (Last 24 Hours): 7.75-9.25 hours  Physical Exam: Physical Exam Vitals and nursing note reviewed.  HENT:     Head: Normocephalic.     Nose: Nose normal.     Mouth/Throat:     Pharynx: Oropharynx is clear.  Cardiovascular:     Rate and Rhythm: Normal rate.     Pulses: Normal pulses.  Pulmonary:     Effort: Pulmonary effort is normal.  Genitourinary:    Comments: Deferred. Musculoskeletal:        General: Normal range of motion.     Cervical back: Normal range of motion.  Skin:    General: Skin is dry.  Neurological:     General: No focal deficit present.     Mental Status: She is alert and oriented to person, place, and time. Mental status is at baseline.    Review of Systems  Constitutional:  Negative for chills, diaphoresis, fever and malaise/fatigue.  HENT:   Negative for congestion and sore throat.   Respiratory:  Negative for cough, shortness of breath and wheezing.   Cardiovascular:  Negative for chest pain and palpitations.  Gastrointestinal:  Negative for abdominal pain, constipation, diarrhea, heartburn, nausea and vomiting.  Genitourinary:  Negative for dysuria.  Musculoskeletal:  Negative for joint pain and myalgias.  Skin:  Negative for itching and rash.  Neurological:  Negative for dizziness, tingling, tremors (Hx of (improved prior to discharge).), sensory change, speech change, focal weakness (UDS (+) for opiate & THC.), seizures, loss of consciousness, weakness and headaches.  Endo/Heme/Allergies:        Allergies: Review lists please.  Psychiatric/Behavioral:  Positive for depression and substance abuse. Negative for hallucinations, memory loss and suicidal ideas. The patient is not nervous/anxious and does not have insomnia.    Blood pressure 121/76, pulse 91, temperature 98.6 F (37 C), temperature source Oral, resp. rate 16, height 5' 2 (1.575 m), weight 62.7 kg, last menstrual period 10/29/2024, SpO2 98%. Body mass index is 25.28 kg/m.  Tobacco Use History[1] Tobacco Cessation:  N/A, patient does not currently use tobacco products  Blood Alcohol level:  Lab Results  Component Value Date   [facility] <15 11/15/2024   Metabolic Disorder Labs:  Lab Results  Component Value Date   HGBA1C 4.9 11/17/2024   MPG 93.93 11/17/2024   MPG 108 11/02/2018   Lab Results  Component Value Date   PROLACTIN 15.9 11/02/2018   Lab Results  Component Value Date   CHOL 182 11/17/2024   TRIG 151 (H) 11/17/2024   HDL 52 11/17/2024   CHOLHDL 3.5 11/17/2024   VLDL 30 11/17/2024   LDLCALC 100 (H) 11/17/2024   LDLCALC 109 11/02/2018   See Psychiatric Specialty Exam and Suicide Risk Assessment completed by Attending Physician prior to discharge.  Discharge destination:  Home  Is patient on multiple antipsychotic therapies at discharge:  No    Has Patient had three or more failed trials of antipsychotic monotherapy by history:  No  Recommended Plan for Multiple Antipsychotic Therapies: NA  Allergies as of 11/18/2024       Reactions   Doxycycline  Rash   Rashes on abdomen, back, L wrist, and L axillary on 7th day of treatment.   Amoxicillin Hives   Has patient had a PCN reaction causing immediate rash, facial/tongue/throat swelling, SOB or lightheadedness with hypotension: yes Has patient had a PCN reaction causing severe rash involving mucus membranes or skin necrosis: no  Has patient had a PCN reaction that required hospitalization: ed visit Has patient had a PCN reaction occurring within the last 10 years: no If all of the above answers are NO, then may proceed with Cephalosporin use.   Nitrofuran Derivatives Itching, Swelling   Lip swelling and urticaria        Medication List     STOP taking these medications    doxycycline  100 MG tablet Commonly known as: VIBRA -TABS       TAKE these medications      Indication  ARIPiprazole 2 MG tablet Commonly known as: ABILIFY Take 1 tablet (2 mg total) by mouth daily. Mood control Start taking on: November 19, 2024  Indication: Antidepressant augmentation.   hydrOXYzine  25 MG tablet Commonly known as: ATARAX  Take 1 tablet (25 mg total) by mouth 3 (three) times daily as needed for anxiety.  Indication: Feeling Anxious   metroNIDAZOLE  500 MG tablet Commonly known as: FLAGYL  Take 1 tablet (500 mg total) by mouth every 12 (twelve) hours. For infection.  Indication: Bacterial vaginitis.   moxifloxacin  400 MG tablet Commonly known as: AVELOX  Take 1 tablet (400 mg total) by mouth daily at 8 pm. For infection. What changed: additional instructions  Indication: Infection.   sertraline 50 MG tablet Commonly known as: ZOLOFT Take 1 tablet (50 mg total) by mouth daily. For depression Start taking on: November 19, 2024  Indication: Major Depressive Disorder    traZODone  50 MG tablet Commonly known as: DESYREL  Take 1 tablet (50 mg total) by mouth at bedtime as needed for sleep.  Indication: Trouble Sleeping        Follow-up Information     [facility] Follow up on 11/25/2024.   Why: You have a hospital follow up appointment with this provider on 11/25/24 at 11:00 am to get established with therapy and medication management. This appointment will be virtual. Contact information: [address]  [address] [place] [address] [phone]                Plan Of Care/Follow-up recommendations:  Activity: as tolerated Diet: heart healthy  Other: -Follow-up with your outpatient psychiatric provider -instructions on appointment date, time, and address (location) are provided to you in discharge paperwork.  -Take your psychiatric medications as prescribed at discharge - instructions are provided to you in the discharge paperwork  -Follow-up with outpatient primary care doctor and other specialists -for management of preventative medicine and chronic medical issues  -Testing: Follow-up with outpatient provider for abnormal lab results: NA  -If you are prescribed an atypical antipsychotic medication, we recommend that your outpatient psychiatrist follow routine screening for side effects within 3 months of discharge, including monitoring: AIMS scale, height, weight, blood pressure, fasting lipid panel, HbA1c, and fasting blood sugar.   -Recommend total abstinence from alcohol, tobacco, and other illicit drug use at discharge.   -If your psychiatric symptoms recur, worsen, or if you have side effects to your psychiatric medications, call your outpatient psychiatric provider, 911, 988 or go to the nearest emergency department.  -If suicidal thoughts occur, immediately call your outpatient psychiatric provider, 911, 988 or go to the nearest emergency department.  Signed: Mac Bolster, NP, pmhnp, fnp-bc. 11/18/2024, 10:22  AM           [1]  Social History Tobacco Use  Smoking Status Never   Passive exposure: Yes  Smokeless Tobacco Never

## 2024-11-18 NOTE — Group Note (Signed)
 Date:  11/18/2024 Time:  10:22 AM  Group Topic/Focus: recreational therapy played finished the lyric  Guess the Lyrics can be a fun and effective tool for mental health and critical thinking by engaging individuals with emotional content, boosting memory, and fostering empathy. By guessing missing lyrics from songs that address themes like resilience, love, or struggle, participants can connect with their own emotions, reflect on their personal experiences, and practice cognitive flexibility. Discussing the meaning of the lyrics afterward encourages deeper self-awareness and perspective-taking, promoting mental well-being. This activity also offers a creative outlet for self-expression and can serve as a conversation starter on important topics like vulnerability, hope, and personal growth.    Participation Level:  Active   Samantha Long [name] 11/18/2024, 10:22 AM

## 2024-11-18 NOTE — Group Note (Signed)
 Date:  11/18/2024 Time:  10:01 AM  Group Topic/Focus: Self assessment orientation group Goals Group:   The focus of this group is to help patients establish daily goals to achieve during treatment and discuss how the patient can incorporate goal setting into their daily lives to aide in recovery. Self Care:   The focus of this group is to help patients understand the importance of self-care in order to improve or restore emotional, physical, spiritual, interpersonal, and financial health.    Participation Level:  Did Not Attend   Samantha Long 11/18/2024, 10:01 AM

## 2024-11-18 NOTE — [facility] Suicide Risk Assessment (Signed)
 Suicide Risk Assessment  Discharge Assessment    [facility] Discharge Suicide Risk Assessment   Principal Problem: Major depressive disorder, single episode  Discharge Diagnoses: Principal Problem:   Major depressive disorder, single episode Active Problems:   Suicide attempt (HCC)  Total Time spent with patient: 45 minutes  Musculoskeletal: Strength & Muscle Tone: within normal limits Gait & Station: normal Patient leans: N/A  Psychiatric Specialty Exam  Presentation  General Appearance:  Appropriate for Environment; Casual; Fairly Groomed  Eye Contact: Good  Speech: Clear and Coherent; Normal Rate  Speech Volume: Normal  Handedness: Right   Mood and Affect  Mood: Euthymic  Duration of Depression Symptoms: No data recorded Affect: Appropriate; Congruent   Thought Process  Thought Processes: Coherent; Goal Directed; Linear  Descriptions of Associations:Intact  Orientation:Full (Time, Place and Person)  Thought Content:Logical  History of Schizophrenia/Schizoaffective disorder:No data recorded Duration of Psychotic Symptoms:No data recorded Hallucinations:Hallucinations: None  Ideas of Reference:None  Suicidal Thoughts:Suicidal Thoughts: No  Homicidal Thoughts:Homicidal Thoughts: No   Sensorium  Memory: Immediate Good; Recent Good; Remote Good  Judgment: Fair  Insight: Fair   Art Therapist  Concentration: Good  Attention Span: Good  Recall: Good  Fund of Knowledge: Fair  Language: Good   Psychomotor Activity  Psychomotor Activity: Psychomotor Activity: Normal  Assets  Assets: Communication Skills; Desire for Improvement; Financial Resources/Insurance; Housing; Physical Health; Resilience; Social Support   Sleep  Sleep: Sleep: Good  Estimated Sleeping Duration (Last 24 Hours): 7.75-9.25 hours  Physical Exam: See H&P.  Blood pressure 121/76, pulse 91, temperature 98.6 F (37 C), temperature source Oral,  resp. rate 16, height 5' 2 (1.575 m), weight 62.7 kg, last menstrual period 10/29/2024, SpO2 98%. Body mass index is 25.28 kg/m.  Mental Status Per Nursing Assessment::   On Admission:     Demographic Factors:  Adolescent or young adult and Low socioeconomic status  Loss Factors: NA  Historical Factors: Impulsivity  Risk Reduction Factors:   Sense of responsibility to family, Living with another person, especially a relative, Positive social support, Positive therapeutic relationship, and Positive coping skills or problem solving skills  Continued Clinical Symptoms:  Depression:   Impulsivity Insomnia Alcohol/Substance Abuse/Dependencies Unstable or Poor Therapeutic Relationship  Cognitive Features That Contribute To Risk:  Polarized thinking and Thought constriction (tunnel vision)    Suicide Risk:  Minimal: No identifiable suicidal ideation.  Patients presenting with no risk factors but with morbid ruminations; may be classified as minimal risk based on the severity of the depressive symptoms   Follow-up Information     [facility] Follow up on 11/25/2024.   Why: You have a hospital follow up appointment with this provider on 11/25/24 at 11:00 am to get established with therapy and medication management. This appointment will be virtual. Contact information: [address]  [address] [place] [address] [phone]                Plan Of Care/Follow-up recommendations:  See the discharge recommendation above.  Mac Bolster, NP, pmhnp, fnp-bc. 11/18/2024, 10:00 AM

## 2024-11-18 NOTE — Progress Notes (Signed)
°  [facility] Adult Case Management Discharge Plan :  Will you be returning to the same living situation after discharge:  Yes,  Pt returning home at discharge At discharge, do you have transportation home?: Yes,  pt will be picked up by mother at 10AM Do you have the ability to pay for your medications: Yes,  pt has active health insurance coverage  Release of information consent forms completed and in the chart;  Patient's signature needed at discharge.  Patient to Follow up at:  Follow-up Information     [facility] Follow up on 11/25/2024.   Why: You have a hospital follow up appointment with this provider on 11/25/24 at 11:00AM to get established with therapy and medication management. This appointment will be virtual. Contact information: [address]  [address] [place] [address] [phone]                 Next level of care provider has access to [facility] Link:no  Safety Planning and Suicide Prevention discussed: Yes,  Mother, Angelissa Supan [phone]     Has patient been referred to the Quitline?: Patient does not use tobacco/nicotine products  Patient has been referred for addiction treatment: No known substance use disorder.  Jenkins LULLA Primer, LCSWA 11/18/2024, 8:34 AM

## 2024-11-18 NOTE — Progress Notes (Signed)
 Pt discharged at this time. Pt left facility with family. Pt removed all belongings. Pt denies SI/HI/AVH. Pt verbalized understanding of medications and follow up care.

## 2024-12-05 ENCOUNTER — Ambulatory Visit
Admission: EM | Admit: 2024-12-05 | Discharge: 2024-12-05 | Disposition: A | Discharge status: Home / Self Care | Attending: Family Medicine | Admitting: Family Medicine

## 2024-12-05 DIAGNOSIS — M25521 Pain in right elbow: Secondary | ICD-10-CM | POA: Diagnosis not present

## 2024-12-05 DIAGNOSIS — M79631 Pain in right forearm: Secondary | ICD-10-CM

## 2024-12-05 DIAGNOSIS — S56911A Strain of unspecified muscles, fascia and tendons at forearm level, right arm, initial encounter: Secondary | ICD-10-CM

## 2024-12-05 MED ORDER — NAPROXEN 500 MG PO TABS: 500.0000 mg | ORAL_TABLET | Freq: Two times a day (BID) | ORAL | 0 refills | Status: AC

## 2024-12-05 MED ORDER — CYCLOBENZAPRINE HCL 5 MG PO TABS: 5.0000 mg | ORAL_TABLET | Freq: Every evening | ORAL | 0 refills | Status: AC | PRN
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
## 2024-12-14 ENCOUNTER — Inpatient Hospital Stay: Admitting: Internal Medicine

## 2024-12-14 ENCOUNTER — Inpatient Hospital Stay: Attending: Internal Medicine
